# Patient Record
Sex: Female | Born: 2000 | Race: Black or African American | Hispanic: No | Marital: Single | Smoking: Never smoker
Health system: Southern US, Community
[De-identification: ages and names within clinical notes are randomized; demographics above are authoritative.]

## PROBLEM LIST (undated history)

## (undated) DIAGNOSIS — N12 Tubulo-interstitial nephritis, not specified as acute or chronic: Secondary | ICD-10-CM

## (undated) DIAGNOSIS — Z7289 Other problems related to lifestyle: Secondary | ICD-10-CM

## (undated) DIAGNOSIS — J45909 Unspecified asthma, uncomplicated: Secondary | ICD-10-CM

## (undated) HISTORY — PX: MOUTH SURGERY: SHX715

## (undated) HISTORY — DX: Unspecified asthma, uncomplicated: J45.909

---
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
## 2018-06-19 ENCOUNTER — Ambulatory Visit (INDEPENDENT_AMBULATORY_CARE_PROVIDER_SITE_OTHER): Payer: Medicaid Other | Admitting: Pediatrics

## 2018-06-19 ENCOUNTER — Encounter: Payer: Self-pay | Admitting: Pediatrics

## 2018-06-19 VITALS — BP 116/72 | HR 106 | Ht 62.0 in | Wt 138.6 lb

## 2018-06-19 DIAGNOSIS — Z113 Encounter for screening for infections with a predominantly sexual mode of transmission: Secondary | ICD-10-CM

## 2018-06-19 NOTE — Patient Instructions (Addendum)
Please call us if there are issues. Your nexlanon is due to be replaced in February 2021.

## 2018-06-19 NOTE — Progress Notes (Signed)
THIS RECORD MAY CONTAIN CONFIDENTIAL INFORMATION THAT SHOULD NOT BE RELEASED WITHOUT REVIEW OF THE SERVICE PROVIDER.  Adolescent Medicine Consultation Follow-Up Visit Julie Cherry  is a 17 y.o. female referred by Gregor Hams, NP here today for follow-up regarding nexplanon.    Last seen in [facility] Clinic on 10/29/17 for nexplanon BTB.  Plan at last visit included stopping OCP as bleeding was more controlled.  Pertinent Labs? No Growth Chart Viewed? yes   History was provided by the patient.  Interpreter? no  PCP Confirmed?  yes  My Chart Activated?   no   Chief Complaint  Patient presents with  . Follow-up    HPI:   No current issues. Stopped OCPs in Sept 2018. Having regular periods, last on 05/31/18, was light. Graduated from [employer], going to [employer] in August.   Sexually active with same partner. Uses condoms. No concerns today.    Review of Systems  Constitutional: Negative for malaise/fatigue.  Eyes: Negative for double vision.  Respiratory: Negative for shortness of breath.   Cardiovascular: Negative for chest pain and palpitations.  Gastrointestinal: Negative for abdominal pain, constipation, diarrhea, nausea and vomiting.  Genitourinary: Negative for dysuria.  Musculoskeletal: Negative for joint pain and myalgias.  Skin: Negative for rash.  Neurological: Negative for dizziness and headaches.  Endo/Heme/Allergies: Does not bruise/bleed easily.  Psychiatric/Behavioral: Negative for depression.     Patient's last menstrual period was 05/31/2018 (exact date). Allergies  Allergen Reactions  . Pork-Derived Products     Patient does not eat pork but can have meds/products with pork    No outpatient medications prior to visit.   No facility-administered medications prior to visit.      Patient Active Problem List   Diagnosis Date Noted  . Breakthrough bleeding on Nexplanon 07/19/2017  . Pyelonephritis 05/27/2016  .  Vitamin D deficiency 01/20/2016  . Heart murmur 12/02/2013  . 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 Vaccination not carried out because of caregiver refusal 09/10/2013  . Allergic rhinitis 09/10/2013     The following portions of the patient's history were reviewed and updated as appropriate: allergies, current medications, past family history, past medical history, past social history, past surgical history and problem list.  Physical Exam:  Vitals:   06/19/18 1555  BP: 116/72  Pulse: (!) 106  Weight: 138 lb 9.6 oz (62.9 kg)  Height: 5\' 2"  (1.575 m)   BP 116/72   Pulse (!) 106   Ht 5\' 2"  (1.575 m)   Wt 138 lb 9.6 oz (62.9 kg)   LMP 05/31/2018 (Exact Date)   BMI 25.35 kg/m  Body mass index: body mass index is 25.35 kg/m. Blood pressure percentiles are not available for patients who are 18 years or older.   Physical Exam  Constitutional: She appears well-developed. No distress.  HENT:  Mouth/Throat: Oropharynx is clear and moist.  Neck: No thyromegaly present.  Cardiovascular: Regular rhythm.  No murmur heard. Tachycardic  Pulmonary/Chest: Breath sounds normal.  Abdominal: Soft. She exhibits no mass. There is no tenderness. There is no guarding.  Musculoskeletal: She exhibits no edema.  Lymphadenopathy:    She has no cervical adenopathy.  Neurological: She is alert.  Skin: Skin is warm. No rash noted.  Psychiatric: She has a normal mood and affect.  Nursing note and vitals reviewed.   Assessment/Plan: 1. Breakthrough bleeding on Nexplanon, improved Has been off OCPs since Sept 2018, bleeding is now more regulated. Will monitor. Advised to call if concerns, new partner, vaginal discharge,  etc.  - Nexplanon due to be replaced in Feb 2021   Follow-up:  As needed   Medical decision-making:  >15 minutes spent face to face with patient with more than 50% of appointment spent discussing diagnosis, management, follow-up, and reviewing of nexplanon, flu  vaccine.

## 2018-06-20 ENCOUNTER — Encounter: Payer: Self-pay | Admitting: Pediatrics

## 2018-06-20 ENCOUNTER — Ambulatory Visit (INDEPENDENT_AMBULATORY_CARE_PROVIDER_SITE_OTHER): Payer: Medicaid Other | Admitting: Pediatrics

## 2018-06-20 VITALS — BP 123/80 | HR 101 | Ht 63.39 in | Wt 139.4 lb

## 2018-06-20 DIAGNOSIS — A749 Chlamydial infection, unspecified: Secondary | ICD-10-CM | POA: Diagnosis not present

## 2018-06-20 DIAGNOSIS — Z113 Encounter for screening for infections with a predominantly sexual mode of transmission: Secondary | ICD-10-CM | POA: Diagnosis not present

## 2018-06-20 DIAGNOSIS — A549 Gonococcal infection, unspecified: Secondary | ICD-10-CM | POA: Diagnosis not present

## 2018-06-20 LAB — C. TRACHOMATIS/N. GONORRHOEAE RNA
C. trachomatis RNA, TMA: DETECTED — AB
N. GONORRHOEAE RNA, TMA: DETECTED — AB

## 2018-06-20 MED ORDER — CEFTRIAXONE SODIUM 250 MG IJ SOLR
250.0000 mg | Freq: Once | INTRAMUSCULAR | Status: AC
  Administered 2018-06-20: 250 mg via INTRAMUSCULAR

## 2018-06-20 MED ORDER — AZITHROMYCIN 250 MG PO TABS
1000.0000 mg | ORAL_TABLET | Freq: Once | ORAL | Status: AC
  Administered 2018-06-20: 1000 mg via ORAL

## 2018-06-20 NOTE — Patient Instructions (Signed)
We will call you with results  

## 2018-06-20 NOTE — Progress Notes (Signed)
History was provided by the patient.  Julie Cherry is a 17 y.o. female who is here for positive test for chalmydia, gonorrhea.     HPI:   She reports no symptoms- no vaginal bleeding, discharge reported. No pain with sex, cramping, abdominal pain.  No fevers, vomiting, rashes, headaches, diarrhea, myalgias, weight loss.  Has had the same partner since 2016. Last sex last month. Reports consistent condom use. She thinks that her partner may have had a new partner.  Patient Active Problem List   Diagnosis Date Noted  . Breakthrough bleeding on Nexplanon 07/19/2017  . Pyelonephritis 05/27/2016  . Vitamin D deficiency 01/20/2016  . Heart murmur 12/02/2013  . 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 Vaccination not carried out because of caregiver refusal 09/10/2013  . Allergic rhinitis 09/10/2013     The following portions of the patient's history were reviewed and updated as appropriate: allergies, current medications, past family history, past medical history, past social history, past surgical history and problem list.  Physical Exam:    Vitals:   06/20/18 1038  BP: 123/80  Pulse: (!) 101  Weight: 139 lb 6.4 oz (63.2 kg)  Height: 5' 3.39" (1.61 m)   Growth parameters are noted and are appropriate for age. Blood pressure percentiles are not available for patients who are 18 years or older. Patient's last menstrual period was 05/31/2018 (exact date).  General: Well developed, well nourished female in no acute distress.   Head: Normocephalic, atraumatic.   Eyes:  Pupils equal and round.   Ears/Nose/Mouth/Throat: Nares patent, mucous membranes moist.   Neck: supple, no cervical lymphadenopathy Cardiovascular: regular rate, normal S1/S2, no murmurs Respiratory: No increased work of breathing.  Lungs clear to auscultation bilaterally.  No wheezes. Abdomen: soft, nontender, nondistended. Normal bowel sounds.  No appreciable masses  Genitourinary: no cervical motion  tenderness, cervial os with white discharge Extremities: warm, well perfused, cap refill < 2 sec.   Musculoskeletal: Normal muscle mass.  Normal strength Skin: warm, dry.  No rash or lesions. Neurologic: alert and oriented, normal speech, no tremor     Assessment/Plan: 1. Chlamydia - azithromycin (ZITHROMAX) tablet 1,000 mg  2. Gonorrhea - azithromycin (ZITHROMAX) tablet 1,000 mg - cefTRIAXone (ROCEPHIN) injection 250 mg  3. Routine screening for STI (sexually transmitted infection)  - HIV antibody - RPR - C. trachomatis/N. gonorrhoeae RNA (re-tested per patient preference as she reports consistent condom use and same partner since last negative screen) - WET PREP BY MOLECULAR PROBE  - Immunizations today: none  - Follow-up visit in 1 month for retest, or sooner as needed.

## 2018-06-21 LAB — C. TRACHOMATIS/N. GONORRHOEAE RNA
C. TRACHOMATIS RNA, TMA: DETECTED — AB
N. GONORRHOEAE RNA, TMA: DETECTED — AB

## 2018-06-22 LAB — WET PREP BY MOLECULAR PROBE
Candida species: NOT DETECTED
MICRO NUMBER: 90854100
SPECIMEN QUALITY:: ADEQUATE
Trichomonas vaginosis: NOT DETECTED

## 2018-06-27 LAB — RPR: RPR Ser Ql: NONREACTIVE

## 2018-06-27 LAB — WET PREP BY MOLECULAR PROBE

## 2018-06-27 LAB — HIV ANTIBODY (ROUTINE TESTING W REFLEX): HIV 1&2 Ab, 4th Generation: NONREACTIVE

## 2018-07-03 ENCOUNTER — Other Ambulatory Visit: Payer: Self-pay

## 2018-07-03 ENCOUNTER — Ambulatory Visit (INDEPENDENT_AMBULATORY_CARE_PROVIDER_SITE_OTHER): Payer: Medicaid Other | Admitting: Student in an Organized Health Care Education/Training Program

## 2018-07-03 ENCOUNTER — Encounter: Payer: Self-pay | Admitting: Student in an Organized Health Care Education/Training Program

## 2018-07-03 ENCOUNTER — Ambulatory Visit (INDEPENDENT_AMBULATORY_CARE_PROVIDER_SITE_OTHER): Payer: Medicaid Other | Admitting: Licensed Clinical Social Worker

## 2018-07-03 VITALS — BP 114/64 | Ht 62.0 in | Wt 141.1 lb

## 2018-07-03 DIAGNOSIS — Z1331 Encounter for screening for depression: Secondary | ICD-10-CM

## 2018-07-03 DIAGNOSIS — Z68.41 Body mass index (BMI) pediatric, 85th percentile to less than 95th percentile for age: Secondary | ICD-10-CM | POA: Diagnosis not present

## 2018-07-03 DIAGNOSIS — E663 Overweight: Secondary | ICD-10-CM

## 2018-07-03 DIAGNOSIS — Z0001 Encounter for general adult medical examination with abnormal findings: Secondary | ICD-10-CM

## 2018-07-03 DIAGNOSIS — Z113 Encounter for screening for infections with a predominantly sexual mode of transmission: Secondary | ICD-10-CM

## 2018-07-03 NOTE — BH Specialist Note (Signed)
Integrated Behavioral Health Initial Visit  MRN: [id number] Name: Julie Cherry  Number of Integrated Behavioral Health Clinician visits:: 1/6 Session Start time: 5:20  Session End time: 5:30 Total time: 10 mins, no charge due to brief visit  Type of Service: Integrated Behavioral Health- Individual/Family Interpretor:No. Interpretor Name and Language: n/a   Warm Hand Off Completed.       SUBJECTIVE: Julie Cherry is a 17 y.o. female accompanied by self Patient was referred by Dr. Lazarus SalinesSegars for PHQ review. [facility] introduced services in [facility] Model and role within the clinic. [facility] provided [facility] and business card with contact information. Pt voiced understanding and denied any need for services at this time. [facility] is open to visits in the future as needed.   OBJECTIVE: Mood: Euthymic and Affect: Appropriate Risk of harm to self or others: No plan to harm self or others  LIFE CONTEXT: Family and Social: Lives w/ parents and siblings School/Work: Pt recently graduated from [employer], will start [employer] in the fall, will pursue nursing, reports feeling excited and a little nervous about starting school Self-Care: Pt reports sleeping when overwhelmed. Pt likes to hang out with friends and family Life Changes: recently graduated from high school, will start college in the fall  GOALS ADDRESSED: Patient will: 1. Identify barriers to social emotional development 2. Increase awareness of [facility] role in integrated care model  INTERVENTIONS: Interventions utilized: Supportive Counseling and Psychoeducation and/or Health Education  Standardized Assessments completed: PHQ 9 Modified for Teens; score of 0, results in flowsheets   Noralyn PickHannah G Moore, LPCA

## 2018-07-03 NOTE — Patient Instructions (Signed)
  Place appropriate patient instructions here. 

## 2018-07-03 NOTE — Progress Notes (Signed)
Adolescent Well Care Visit Julie Cherry is a 17 y.o. female who is here for well care.    PCP:  Ander Slade, NP   History was provided by the patient.  Confidentiality was discussed with the patient and, if applicable, with caregiver as well. Patient's personal or confidential phone number: [phone]   Current Issues: - Recent illness: congestion, sore throat, cold, body aches, ear pain starting Saturday. No fever. All symptoms improving with ibuprofen, nyquil, dayquil. Last dose of nyquil last night at 10pm. No fever, vaginal discharge, headache, Contact with sister with URI. - Sexual health (BV, G+C positive two weeks ago): has not been sexually active since positive G+C two weeks ago. Still with sexually partner and plan to have sex in the future. Using condoms,  given more condoms today. No vaginal discharge, pelvic pain. Nexplanon for one year.  - Irregular menstrual bleeding: well controlled. LMP 06/30/18. - Allergies: controlled - Self harm: controlled, no SI for 5 years - Vaccines needed for college -- mom concerned about need for MenB, HepA vaccine. Mom reports that Varicella vaccine was given in childhood, though not documented at [facility].  Mom denies HPV vaccine.   Nutrition: Nutrition/Eating Behaviors: good Adequate calcium in diet?: yes Supplements/ Vitamins: no  Exercise/ Media: Play any Sports?/ Exercise: yes, walk at water park, cleaning Screen Time:  "all the time" Media Rules or Monitoring?: no  Sleep:  Sleep: no   Social Screening: Lives with:  Mom, sister, brother Parental relations:  good Activities, Work, and Research officer, political party?: work  Concerns regarding behavior with peers?  no Stressors of note: no  Education: School Name: About to start [employer] Grade: freshman college School performance: doing well; no concerns School Behavior: doing well; no concerns  Menstruation:   Patient's last menstrual period was 05/31/2018  (exact date). Menstrual History: irregular since Nexplanon one year ago, but well controlled recently   Confidential Social History: Tobacco?  no Secondhand smoke exposure?  no Drugs/ETOH?  yes, MJ one month ago, used to do two time per month  Sexually Active?  Not for two weeks Pregnancy Prevention: nnexplanon  Safe at home, in school & in relationships?  Yes Safe to self?  Yes   Screenings: Patient has a dental home: yes, [facility], [employer]  The patient completed the Rapid Assessment for Adolescent Preventive Services screening questionnaire and the following topics were identified as risk factors and discussed: condom use, birth control, sexuality and suicidality/self harm  In addition, the following topics were discussed as part of anticipatory guidance healthy eating, exercise, tobacco use, marijuana use, drug use, condom use, birth control, sexuality, suicidality/self harm, mental health issues, school problems, family problems and screen time.  PHQ-9 completed and results indicated no concern for depression  Physical Exam:  Vitals:   07/03/18 1557  BP: 114/64  Weight: 141 lb 2 oz (64 kg)  Height: 5' 2"  (1.575 m)   BP 114/64 (BP Location: Left Arm, Patient Position: Sitting, Cuff Size: Normal)   Ht 5' 2"  (1.575 m)   Wt 141 lb 2 oz (64 kg)   LMP 05/31/2018 (Exact Date)   BMI 25.81 kg/m  Body mass index: body mass index is 25.81 kg/m. Blood pressure percentiles are not available for patients who are 18 years or older.   Hearing Screening   Method: Audiometry   125Hz  250Hz  500Hz  1000Hz  2000Hz  3000Hz  4000Hz  6000Hz  8000Hz   Right ear:   20 20 20  20     Left  ear:   20 20 20  20       Visual Acuity Screening   Right eye Left eye Both eyes  Without correction:     With correction: 10/10 10/10 10/10     General Appearance:   alert, oriented, no acute distress  HENT: Normocephalic, no obvious abnormality, conjunctiva clear  Mouth:   Normal appearing  teeth, no obvious discoloration, dental caries, or dental caps, no erythema or exudate of oropharynx  Neck:   Supple; thyroid: no enlargement, symmetric. Tenderness with mild cervical lympahdenopathy.  Chest Nontender. Breasts symmetric without masses.  Lungs:   Clear to auscultation bilaterally, normal work of breathing  Heart:   Regular rate and rhythm, S1 and S2 normal, no murmurs;   Abdomen:   Soft, non-tender, no mass, or organomegaly  GU genitalia not examined  Musculoskeletal:   Tone and strength strong and symmetrical, all extremities               Lymphatic:   No cervical adenopathy  Skin/Hair/Nails:   Skin warm, dry and intact, no rashes, no bruises or petechiae  Neurologic:   Strength, gait, and coordination normal and age-appropriate     Assessment and Plan:   1. Encounter for general adult medical examination with abnormal findings Moving to [place] for college in one week. BMI is not appropriate for age Hearing screening result:normal Vision screening result: normal  2. Overweight, pediatric, BMI 85.0-94.9 percentile for age BMI 85%. Diverse diet, gets daily exercise. Discussed healthy diet, exercise habits.  - Lipid panel per screening protocol.  3. Routine screening for STI (sexually transmitted infection) Nexplanon in place for one year. No vaginal discharge or pelvic pain or fevers. Completed azithromycin and ceftriaxone at last visit. Plan to repeat test of reinfection in 1-3 months.    No follow-ups on file.Harlon Ditty, MD

## 2018-07-04 LAB — LIPID PANEL
Cholesterol: 134 mg/dL (ref <170)
HDL: 45 mg/dL — ABNORMAL LOW (ref >45)
LDL CHOLESTEROL (CALC): 76 mg/dL (ref <110)
Non-HDL Cholesterol (Calc): 89 mg/dL (calc) (ref <120)
TRIGLYCERIDES: 51 mg/dL (ref <90)
Total CHOL/HDL Ratio: 3 (calc) (ref <5.0)

## 2018-07-16 DIAGNOSIS — Z23 Encounter for immunization: Secondary | ICD-10-CM | POA: Diagnosis not present

## 2018-11-15 ENCOUNTER — Encounter (HOSPITAL_COMMUNITY): Payer: Self-pay

## 2018-11-15 ENCOUNTER — Other Ambulatory Visit: Payer: Self-pay

## 2018-11-15 ENCOUNTER — Ambulatory Visit (HOSPITAL_COMMUNITY)
Admission: EM | Admit: 2018-11-15 | Discharge: 2018-11-15 | Disposition: A | Discharge status: Home / Self Care | Payer: Medicaid Other | Attending: Family Medicine | Admitting: Family Medicine

## 2018-11-15 DIAGNOSIS — J111 Influenza due to unidentified influenza virus with other respiratory manifestations: Secondary | ICD-10-CM | POA: Diagnosis not present

## 2018-11-15 DIAGNOSIS — M549 Dorsalgia, unspecified: Secondary | ICD-10-CM | POA: Diagnosis present

## 2018-11-15 DIAGNOSIS — R51 Headache: Secondary | ICD-10-CM | POA: Diagnosis present

## 2018-11-15 LAB — POCT URINALYSIS DIP (DEVICE)
Bilirubin Urine: NEGATIVE
GLUCOSE, UA: NEGATIVE mg/dL
Ketones, ur: 15 mg/dL — AB
Leukocytes, UA: NEGATIVE
Nitrite: NEGATIVE
Protein, ur: NEGATIVE mg/dL
SPECIFIC GRAVITY, URINE: 1.015 (ref 1.005–1.030)
Urobilinogen, UA: 0.2 mg/dL (ref 0.0–1.0)
pH: 6 (ref 5.0–8.0)

## 2018-11-15 MED ORDER — OSELTAMIVIR PHOSPHATE 75 MG PO CAPS: 75.0000 mg | ORAL_CAPSULE | Freq: Two times a day (BID) | ORAL | 0 refills | Status: DC

## 2018-11-15 NOTE — Discharge Instructions (Signed)
Your urine test does not show infection.  We are running a urine culture to make absolutely certain that there is no bacteria in the urinary system.  The headache, muscle aches, and fever are most typical for influenza.  For that reason I am prescribing flu medicine.  You will feel better if you take some ibuprofen every 6 hours the next couple days.

## 2018-11-15 NOTE — ED Provider Notes (Signed)
[facility]    CSN: [id number] Arrival date & time: 11/15/18  1521     History   Chief Complaint Chief Complaint  Patient presents with  . Back Pain    HPI Julie Cherry is a 17 y.o. female.   This is a 17 year old female(initial [facility] visit)who presents with headaches, back pain, and fever for the last 4 days.  She has a history of pyelonephritis.  She states that the symptoms are getting worse over the last 24 hours.  She has not been in contact with anybody with the flu that she knows of.  No nausea or vomiting, cough is minimally productive-patient has not looked at what the phlegm looks like.  She is not short of breath.  She has no urinary symptoms.  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  She is not a very good historian.     Past Medical History:  Diagnosis Date  . Asthma    Last had symptoms age 89.     Patient Active Problem List   Diagnosis Date Noted  . Breakthrough bleeding on Nexplanon 07/19/2017  . Pyelonephritis 05/27/2016  . Vitamin D deficiency 01/20/2016  . Heart murmur 12/02/2013  . 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 Vaccination not carried out because of caregiver refusal 09/10/2013  . Allergic rhinitis 09/10/2013    Past Surgical History:  Procedure Laterality Date  . MOUTH SURGERY    . TOOTH EXTRACTION  Aug 2014   severe decay    OB History   No obstetric history on file.      Home Medications    Prior to Admission medications   Medication Sig Start Date End Date Taking? Authorizing Provider  oseltamivir (TAMIFLU) 75 MG capsule Take 1 capsule (75 mg total) by mouth every 12 (twelve) hours. 11/15/18   Elvina SidleLauenstein, Amana Bouska, MD    Family History Family History  Problem Relation Age of Onset  . Asthma Father   . Diabetes Paternal Grandfather     Social History Social History   Tobacco Use  . Smoking status: Never Smoker  . Smokeless tobacco: Never Used  Substance Use Topics  . Alcohol  use: No  . Drug use: No     Allergies   Pork-derived products   Review of Systems Review of Systems   Physical Exam Triage Vital Signs ED Triage Vitals  Enc Vitals Group     BP 11/15/18 1536 121/62     Pulse --      Resp 11/15/18 1536 16     Temp 11/15/18 1536 (!) 100.6 F (38.1 C)     Temp Source 11/15/18 1536 Oral     SpO2 11/15/18 1536 100 %     Weight 11/15/18 1535 130 lb (59 kg)     Height --      Head Circumference --      Peak Flow --      Pain Score 11/15/18 1534 8     Pain Loc --      Pain Edu? --      Excl. in GC? --    No data found.  Updated Vital Signs BP 121/62 (BP Location: Right Arm)   Temp (!) 100.6 F (38.1 C) (Oral)   Resp 16   Wt 59 kg   LMP 11/10/2018   SpO2 100%   BMI 23.78 kg/m    Physical Exam Vitals signs and nursing note reviewed.  Constitutional:      Appearance: Normal appearance.  HENT:     Head: Normocephalic.     Right Ear: Tympanic membrane and external ear normal.     Left Ear: Tympanic membrane and external ear normal.     Nose: Nose normal.     Mouth/Throat:     Mouth: Mucous membranes are moist.  Eyes:     Conjunctiva/sclera: Conjunctivae normal.  Neck:     Musculoskeletal: Normal range of motion and neck supple.  Cardiovascular:     Rate and Rhythm: Normal rate.     Heart sounds: Normal heart sounds.  Pulmonary:     Effort: Pulmonary effort is normal.     Breath sounds: Normal breath sounds.  Musculoskeletal: Normal range of motion.  Skin:    General: Skin is warm and dry.  Neurological:     General: No focal deficit present.     Mental Status: She is alert.  Psychiatric:        Mood and Affect: Mood normal.        Thought Content: Thought content normal.        Judgment: Judgment normal.      UC Treatments / Results  Labs (all labs ordered are listed, but only abnormal results are displayed) Labs Reviewed  POCT URINALYSIS DIP (DEVICE) - Abnormal; Notable for the following components:       Result Value   Ketones, ur 15 (*)    Hgb urine dipstick MODERATE (*)    All other components within normal limits  URINE CULTURE    EKG None  Radiology No results found.  Procedures Procedures (including critical care time)  Medications Ordered in UC Medications - No data to display  Initial Impression / Assessment and Plan / UC Course  I have reviewed the triage vital signs and the nursing notes.  Pertinent labs & imaging results that were available during my care of the patient were reviewed by me and considered in my medical decision making (see chart for details).    Final Clinical Impressions(s) / UC Diagnoses   Final diagnoses:  Influenza     Discharge Instructions     Your urine test does not show infection.  We are running a urine culture to make absolutely certain that there is no bacteria in the urinary system.  The headache, muscle aches, and fever are most typical for influenza.  For that reason I am prescribing flu medicine.  You will feel better if you take some ibuprofen every 6 hours the next couple days.    ED Prescriptions    Medication Sig Dispense Auth. Provider   oseltamivir (TAMIFLU) 75 MG capsule Take 1 capsule (75 mg total) by mouth every 12 (twelve) hours. 10 capsule Elvina Sidle, MD     Controlled Substance Prescriptions [place] Controlled Substance Registry consulted? Not Applicable   Elvina Sidle, MD 11/15/18 1600

## 2018-11-15 NOTE — ED Triage Notes (Signed)
Pt cc back pain and headaches x 4 days

## 2018-11-17 ENCOUNTER — Telehealth (HOSPITAL_COMMUNITY): Payer: Self-pay | Admitting: Emergency Medicine

## 2018-11-17 LAB — URINE CULTURE: Culture: 10000 — AB

## 2018-11-17 MED ORDER — SULFAMETHOXAZOLE-TRIMETHOPRIM 800-160 MG PO TABS: 1.0000 | ORAL_TABLET | Freq: Two times a day (BID) | ORAL | 0 refills | Status: AC

## 2018-11-17 NOTE — Telephone Encounter (Signed)
Pt called back about results. All questions answered.  

## 2018-11-17 NOTE — Telephone Encounter (Signed)
Urine culture was positive Proteus mirabilist. Prescription for bactrim per protocol sent to pharmacy of choice. Attempted to reach patient. No answer at this time. Voicemail left.

## 2019-05-14 DIAGNOSIS — H52223 Regular astigmatism, bilateral: Secondary | ICD-10-CM | POA: Diagnosis not present

## 2019-05-15 DIAGNOSIS — H5213 Myopia, bilateral: Secondary | ICD-10-CM | POA: Diagnosis not present

## 2019-06-03 DIAGNOSIS — H52223 Regular astigmatism, bilateral: Secondary | ICD-10-CM | POA: Diagnosis not present

## 2019-06-30 ENCOUNTER — Other Ambulatory Visit: Payer: Self-pay | Admitting: Pediatrics

## 2019-06-30 ENCOUNTER — Encounter: Payer: Self-pay | Admitting: Pediatrics

## 2019-07-04 ENCOUNTER — Telehealth: Payer: Self-pay | Admitting: Pediatrics

## 2019-07-04 NOTE — Telephone Encounter (Signed)

## 2019-07-07 ENCOUNTER — Encounter: Payer: Self-pay | Admitting: Pediatrics

## 2019-07-07 ENCOUNTER — Ambulatory Visit (INDEPENDENT_AMBULATORY_CARE_PROVIDER_SITE_OTHER): Payer: Medicaid Other | Admitting: Pediatrics

## 2019-07-07 ENCOUNTER — Other Ambulatory Visit: Payer: Self-pay

## 2019-07-07 VITALS — BP 94/62 | HR 83 | Ht 62.25 in | Wt 137.0 lb

## 2019-07-07 DIAGNOSIS — Z8639 Personal history of other endocrine, nutritional and metabolic disease: Secondary | ICD-10-CM | POA: Diagnosis not present

## 2019-07-07 DIAGNOSIS — Z113 Encounter for screening for infections with a predominantly sexual mode of transmission: Secondary | ICD-10-CM

## 2019-07-07 DIAGNOSIS — Z0001 Encounter for general adult medical examination with abnormal findings: Secondary | ICD-10-CM

## 2019-07-07 DIAGNOSIS — Z68.41 Body mass index (BMI) pediatric, 5th percentile to less than 85th percentile for age: Secondary | ICD-10-CM | POA: Diagnosis not present

## 2019-07-07 LAB — POCT RAPID HIV: Rapid HIV, POC: NEGATIVE

## 2019-07-07 NOTE — Patient Instructions (Signed)
Well Child Safety, Young Adult This sheet provides general safety recommendations. Talk with a health care provider if you have any questions. Home safety  Make sure your home or apartment has smoke detectors and carbon monoxide detectors. Test them once a month. Change their batteries every year.  If you keep guns and ammunition in the home, make sure they are stored separately and locked away.  Make your home a tobacco-free and drug-free environment. Motor vehicle safety   Wear a seat belt whenever you drive or ride in a vehicle.  Do not text, talk, or use your phone or other mobile devices while driving.  Do not drive when you are tired. If you feel like you may fall asleep while driving, pull over at a safe location and take a break or switch drivers.  Do not drive after drinking or using drugs. Plan for a designated driver or another way to go home.  Do not ride in a car with someone who has been using drugs or alcohol.  Do not ride in the bed or cargo area of a pickup truck. Sun safety   Use broad-spectrum sunscreen that protects against UVA and UVB radiation (SPF 15 or higher). ? Put on sunscreen 15-30 minutes before going outside. ? Reapply sunscreen every 2 hours, or more often if you get wet or if you are sweating. ? Use enough sunscreen to cover all exposed areas. Rub it in well.  Wear sunglasses when you are out in the sun.  Do not use tanning beds. Tanning beds are just as harmful for your skin as the sun. Water safety  Never swim alone.  Only swim in designated areas.  Do not swim in areas where you do not know the water conditions or where underwater hazards are located. General instructions  Protect your hearing and avoid exposure to loud music or noises by: ? Wearing ear protection when you are in a noisy environment (while using loud machinery, like a lawn mower, or at concerts). ? Making sure that the volume is not too loud when listening to music in  the car or through headphones.  Avoid tattoos and body piercings. Tattoos and body piercings can get infected. Personal safety  Do not use tobacco, drugs, anabolic steroids, or diet pills.  Do not drink or use drugs while swimming, boating, riding a bike or motorcycle, or using heavy machinery.  Do not drink heavily (binge drink). Your brain is still developing, and alcohol can affect your brain development.  Wear protective gear for sports and other physical activities, such as a helmet, mouth guard, eye protection, wrist guards, elbow pads, and knee pads. Wear a helmet when biking, riding a motorcycle or all-terrain vehicle (ATV), skateboarding, skiing, or snowboarding.  If you are sexually active, practice safe sex. Use a condom or other form of birth control (contraception) in order to prevent pregnancy and STIs (sexually transmitted infections).  Never leave a party or event alone without telling a friend that you are leaving. Never leave with a stranger.  Do not misuse medicines. This means that you should not take a medicine other than how it is prescribed and you should not take someone else's medicine.  Avoid risky situations or situations where you do not feel safe. Call for help if you find yourself in an unsafe situation.  Learn to manage conflict without using violence.  Never accept a drink from a stranger if you do not know where the drink came from.  Avoid people  who suggest unsafe or harmful behavior, and avoid unhealthy romantic relationships or friendships where you do not feel respected. No one has the right to pressure you into any activity that makes you feel uncomfortable. If others make you feel unsafe, you can: ? Ask for help from your parents or guardians, your health care provider, or other trusted adults like a teacher, coach, or counselor. ? Call the National Domestic Violence Hotline at 800-799-7233 or go online: www.thehotline.org Where to find more  information:  American Academy of Pediatrics: www.healthychildren.org  Centers for Disease Control and Prevention: www.cdc.gov Summary  Protect yourself from sun exposure by using broad-spectrum sunscreen that protects against UVA and UVB radiation (SPF 15 or higher).  Wear appropriate protective gear when playing sports and doing other activities. Gear may include a helmet, mouth guard, eye protection, wrist guards, and elbow and knee pads.  Be safe when driving or riding in vehicles. While driving: Wear a seat belt. Do not use your mobile device. Do not drink or use drugs.  Always be aware of your surroundings. Avoid risky situations or places where you feel unsafe.  Avoid relationships or friendships in which you do not feel respected. It is okay to ask for help from your parents or guardians, your health care provider, or other trusted adults like a teacher, coach, or counselor. This information is not intended to replace advice given to you by your health care provider. Make sure you discuss any questions you have with your health care provider. Document Released: 07/02/2017 Document Revised: 03/10/2019 Document Reviewed: 07/02/2017 Elsevier Patient Education  2020 Elsevier Inc.  

## 2019-07-07 NOTE — Progress Notes (Signed)
Adolescent Well Care Visit Julie Cherry is a 18 y.o. female who is here for well care.    PCP:  Gregor Hamsebben, Daimian Sudberry, NP   History was provided by the patient.  Confidentiality was discussed with the patient and, if applicable, with caregiver as well. Patient's personal or confidential phone number: [phone]   Current Issues: Current concerns include:  Just ended a period last week that lasted 8 days, which is longer than usual.   Nutrition: Nutrition/Eating Behaviors: tries to eat a variety of foods.  Was on the meal plan at school, now living at home Adequate calcium in diet?: milk and yogurt Supplements/ Vitamins: no  Exercise/ Media: Play any Sports?/ Exercise: had access to gym at school, takes walks Screen Time:  > 2 hours-counseling provided Media Rules or Monitoring?: no  Sleep:  Sleep: no problems  Social Screening: Lives with:  Mom, brother and sister.  Was living in the dorm last year at college but this fall will start online courses and live at home Parental relations:  good Activities, Work, and Regulatory affairs officerChores?: working at [employer] Concerns regarding behavior with peers?  no Stressors of note: pandemic, uncertainty about school  Education: School Name[employer]  School Grade: rising sophomore School performance: did well last year, liked her nursing courses School Behavior: doing well; no concerns  Menstruation:   Patient's last menstrual period was 07/05/2019 (exact date). Menstrual History: periods have been more or less regular but last one lasted longer than usual   Confidential Social History: Tobacco?  no Secondhand smoke exposure?  no Drugs/ETOH?  no  Sexually Active?  Yes.  Denies GU complaints or discharge Pregnancy Prevention: Nexplanon  Safe at home, in school & in relationships?  Yes Safe to self?  Yes   Screenings: Patient has a dental home: yes  The patient completed the Rapid Assessment for Adolescent  Preventive Services screening questionnaire and the following topics were identified as risk factors and discussed: weapon use and condom useCarries pepper spray when on campus.   In addition, the following topics were discussed as part of anticipatory guidance healthy eating and exercise.  PHQ-9 completed and results indicated no concerns for depression  Physical Exam:  Vitals:   07/07/19 1337  BP: 94/62  Pulse: 83  SpO2: 100%  Weight: 137 lb (62.1 kg)  Height: 5' 2.25" (1.581 m)   BP 94/62 (BP Location: Right Arm, Patient Position: Sitting, Cuff Size: Normal)   Pulse 83   Ht 5' 2.25" (1.581 m)   Wt 137 lb (62.1 kg)   LMP 07/05/2019 (Exact Date)   SpO2 100%   BMI 24.86 kg/m  Body mass index: body mass index is 24.86 kg/m. Blood pressure percentiles are not available for patients who are 18 years or older.   Hearing Screening   Method: Audiometry   125Hz  250Hz  500Hz  1000Hz  2000Hz  3000Hz  4000Hz  6000Hz  8000Hz   Right ear:   25 40 20  20    Left ear:   20 20 20  20       Visual Acuity Screening   Right eye Left eye Both eyes  Without correction: 20/20 20/20 20/20   With correction:       General Appearance:   alert, cooperative, pleasant young lady  HENT: Normocephalic, no obvious abnormality, conjunctiva clear, RRx2  Mouth:   Normal appearing teeth, no obvious discoloration, dental caries, or dental caps  Neck:   Supple; thyroid: no enlargement, symmetric, no tenderness/mass/nodules  Chest No breast masses, Tanner 5  Lungs:   Clear to auscultation bilaterally, normal work of breathing  Heart:   Regular rate and rhythm, S1 and S2 normal, no murmurs;   Abdomen:   Soft, non-tender, no mass, or organomegaly  GU genitalia not examined, Tanner stage 5  Musculoskeletal:   Tone and strength strong and symmetrical, all extremities               Lymphatic:   No cervical adenopathy  Skin/Hair/Nails:   Skin warm, dry and intact, no rashes, no bruises or petechiae  Neurologic:    Strength, gait, and coordination normal and age-appropriate     Assessment and Plan:   Well young adult Hx of Vitamin Deficiency   BMI is appropriate for age  Hearing screening result:normal Vision screening result: normal  Immunizations- has not had HPV series or second MCV.  Will need to get from [facility]  Labs per orders:  HgA1c, Vitamin D level  Orders Placed This Encounter  Procedures  . C. trachomatis/N. gonorrhoeae RNA  . POCT Rapid HIV    Return in 1 year for next [facility], or sooner if needed   Ander Slade, PPCNP-BC

## 2019-07-08 LAB — VITAMIN D 25 HYDROXY (VIT D DEFICIENCY, FRACTURES): Vit D, 25-Hydroxy: 12 ng/mL — ABNORMAL LOW (ref 30–100)

## 2019-07-08 LAB — C. TRACHOMATIS/N. GONORRHOEAE RNA
C. trachomatis RNA, TMA: DETECTED — AB
N. gonorrhoeae RNA, TMA: NOT DETECTED

## 2019-07-08 LAB — HEMOGLOBIN A1C
Hgb A1c MFr Bld: 4.9 % of total Hgb (ref <5.7)
Mean Plasma Glucose: 94 (calc)
eAG (mmol/L): 5.2 (calc)

## 2019-07-08 NOTE — Progress Notes (Signed)
Generic message left to call [facility].

## 2019-07-09 ENCOUNTER — Other Ambulatory Visit: Payer: Self-pay | Admitting: Pediatrics

## 2019-07-09 DIAGNOSIS — A749 Chlamydial infection, unspecified: Secondary | ICD-10-CM

## 2019-07-09 DIAGNOSIS — E559 Vitamin D deficiency, unspecified: Secondary | ICD-10-CM

## 2019-07-09 HISTORY — DX: Chlamydial infection, unspecified: A74.9

## 2019-07-09 MED ORDER — VITAMIN D 50 MCG (2000 UT) PO TABS: ORAL_TABLET | ORAL | 11 refills | Status: DC

## 2019-07-09 MED ORDER — AZITHROMYCIN 500 MG PO TABS: ORAL_TABLET | ORAL | 0 refills | Status: DC

## 2019-07-09 NOTE — Progress Notes (Signed)
Patient notified of results and treatment plan, including low Vit D. She would like medications sent to the pharmacy.  Also notified

## 2019-07-09 NOTE — Progress Notes (Signed)
Attempted to contact patient. Message "call can not be completed at this time". Will send letter to call for results.

## 2019-08-05 ENCOUNTER — Other Ambulatory Visit: Payer: Self-pay | Admitting: Pediatrics

## 2019-08-06 ENCOUNTER — Encounter: Payer: Self-pay | Admitting: Pediatrics

## 2019-08-06 ENCOUNTER — Ambulatory Visit (INDEPENDENT_AMBULATORY_CARE_PROVIDER_SITE_OTHER): Payer: Medicaid Other | Admitting: Pediatrics

## 2019-08-06 ENCOUNTER — Other Ambulatory Visit: Payer: Self-pay

## 2019-08-06 VITALS — BP 102/68 | Ht 62.5 in | Wt 134.2 lb

## 2019-08-06 DIAGNOSIS — Z9189 Other specified personal risk factors, not elsewhere classified: Secondary | ICD-10-CM

## 2019-08-06 HISTORY — DX: Other specified personal risk factors, not elsewhere classified: Z91.89

## 2019-08-06 NOTE — Patient Instructions (Signed)
Preventing Sexually Transmitted Infections, Adult Sexually transmitted infections (STIs) are diseases that are passed (transmitted) from person to person through bodily fluids exchanged during sex or sexual contact. Bodily fluids include saliva, semen, blood, vaginal mucus, and urine. You may have an increased risk for developing an STI if you have unprotected oral, vaginal, or anal sex. Some common STIs include:  Herpes.  Hepatitis B.  Chlamydia.  Gonorrhea.  Syphilis.  HPV (human papillomavirus).  HIV (human immunodeficiency virus), the virus that can cause AIDS (acquired immunodeficiency syndrome). How can I protect myself from sexually transmitted infections? The only way to completely prevent STIs is not to have sex of any kind (practice abstinence). This includes oral, vaginal, or anal sex. If you are sexually active, take these actions to lower your risk of getting an STI:  Have only one sex partner (be monogamous) or limit the number of sexual partners you have.  Stay up-to-date on immunizations. Certain vaccines can lower your risk of getting certain STIs, such as: ? Hepatitis A and B vaccines. You may have been vaccinated as a young child, but likely need a booster shot as a teen or young adult. ? HPV vaccine.  Use methods that prevent the exchange of body fluids between partners (barrier protection) every time you have sex. Barrier protection can be used during oral, vaginal, or anal sex. Commonly used barrier methods include: ? Female condom. ? Female condom. ? Dental dam.  Get tested regularly for STIs. Have your sexual partner get tested regularly as well.  Avoid mixing alcohol, drugs, and sex. Alcohol and drug use can affect your ability to make good decisions and can lead to risky sexual behaviors.  Ask your health care provider about taking pre-exposure prophylaxis (PrEP) to prevent HIV infection if you: ? Have a HIV-positive sexual partner. ? Have multiple sexual  partners or partners who do not know their HIV status, and do not regularly use a condom during sex. ? Use injection drugs and share needles. Birth control pills, injections, implants, and intrauterine devices (IUDs) do not protect against STIs. To prevent both STIs and pregnancy, always use a condom with another form of birth control. Some STIs, such as herpes, are spread through skin to skin contact. A condom does not protect you from getting such STIs. If you or your partner have herpes and there is an active flare with open sores, avoid all sexual contact. Why are these changes important? Taking steps to practice safe sex protects you and others. Many STIs can be cured. However, some STIs are not curable and will affect you for the rest of your life. STIs can be passed on to another person even if you do not have symptoms. What can happen if changes are not made? Certain STIs may:  Require you to take medicine for the rest of your life.  Affect your ability to have children (your fertility).  Increase your risk for developing another STI or certain serious health conditions, such as: ? Cervical cancer. ? Head and neck cancer. ? Pelvic inflammatory disease (PID) in women. ? Organ damage or damage to other parts of your body, if the infection spreads.  Be passed to a baby during childbirth. How are sexually transmitted infections treated? If you or your partner know or think that you may have an STI:  Talk with your health care provider about what can be done to treat it. Some STIs can be treated and cured with medicines.  For curable STIs, you and   your partner should avoid sex during treatment and for several days after treatment is complete.  You and your partner should both be treated at the same time, if there is any chance that your partner is infected as well. If you get treatment but your partner does not, your partner can re-infect you when you resume sexual contact.  Do not  have unprotected sex. Where to find more information Learn more about sexually transmitted diseases and infections from:  Centers for Disease Control and Prevention: ? More information about specific STIs: www.cdc.gov/std ? Find places to get sexual health counseling and treatment for free or for a low cost: gettested.cdc.gov  [place].S[place] Department of Health and Human Services: www.womenshealth.gov/publications/our-publications/fact-sheet/sexually-transmitted-infections.html Summary  The only way to completely prevent STIs is not to have sex (practice abstinence), including oral, vaginal, or anal sex.  STIs can spread through saliva, semen, blood, vaginal mucus, urine, or sexual contact.  If you do have sex, limit your number of sexual partners and use a barrier protection method every time you have sex.  If you develop an STI, get treated right away and ask your partner to be treated as well. Do not resume having sex until both of you have completed treatment for the STI. This information is not intended to replace advice given to you by your health care provider. Make sure you discuss any questions you have with your health care provider. Document Released: 11/16/2016 Document Revised: 04/26/2018 Document Reviewed: 11/16/2016 Elsevier Patient Education  2020 Elsevier Inc.  

## 2019-08-06 NOTE — Progress Notes (Signed)
Blood pressure percentiles are not available for patients who are 18 years or older.

## 2019-08-06 NOTE — Progress Notes (Signed)
  Subjective:     Patient ID: [id number] Julie Cherry, female   DOB: 24-Apr-2001, 18 y.o.   MRN: [id number]  HPI:  18 year old female in to be screened for STI.  At her wellness visit, 07/07/2019, she was screened and had to be treated for Chlamydia.  Azithromycin was sent to her pharmacy and she took as directed.  She is not certain her partner got treated though he said he did.  Since that clinic visit, she has had sex and the condom "broke".  LMP 07/31/2019.  She denies any symptoms of abdominal pain or vaginal discharge.  No lesions on genitalia.   Review of Systems     Objective:   Physical Exam- Alert, active teen.  No further exam done today     Assessment:     At risk for STI due to insufficient protection      Plan:     Urine for GC/Chlamydia  Will call for abnormal results and treatment  Condoms given   Ander Slade, PPCNP-BC

## 2019-08-07 LAB — C. TRACHOMATIS/N. GONORRHOEAE RNA
C. trachomatis RNA, TMA: NOT DETECTED
N. gonorrhoeae RNA, TMA: NOT DETECTED

## 2019-08-16 ENCOUNTER — Encounter (HOSPITAL_COMMUNITY): Payer: Self-pay

## 2019-08-16 ENCOUNTER — Other Ambulatory Visit: Payer: Self-pay

## 2019-08-16 ENCOUNTER — Ambulatory Visit (HOSPITAL_COMMUNITY)
Admission: EM | Admit: 2019-08-16 | Discharge: 2019-08-16 | Disposition: A | Discharge status: Home / Self Care | Payer: Medicaid Other | Attending: Emergency Medicine | Admitting: Emergency Medicine

## 2019-08-16 DIAGNOSIS — L089 Local infection of the skin and subcutaneous tissue, unspecified: Secondary | ICD-10-CM

## 2019-08-16 DIAGNOSIS — S0183XA Puncture wound without foreign body of other part of head, initial encounter: Secondary | ICD-10-CM | POA: Diagnosis not present

## 2019-08-16 DIAGNOSIS — R590 Localized enlarged lymph nodes: Secondary | ICD-10-CM

## 2019-08-16 MED ORDER — AMOXICILLIN-POT CLAVULANATE 875-125 MG PO TABS: 1.0000 | ORAL_TABLET | Freq: Two times a day (BID) | ORAL | 0 refills | Status: AC

## 2019-08-16 NOTE — ED Provider Notes (Addendum)
[facility]    CSN: [id number] Arrival date & time: 08/16/19  1256      History   Chief Complaint Chief Complaint  Patient presents with  . Swollen Chin    HPI Julie Cherry is a 18 y.o. female.   Patient presents with swelling under her chin x2 days.  She states this started after she had her gum above her upper front teeth pierced.  She denies fever, chills, difficulty swallowing, sore throat, or other symptoms.  LMP: 08/15/2019.  The history is provided by the patient.    Past Medical History:  Diagnosis Date  . Asthma    Last had symptoms age 89.   . 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 Pyelonephritis 05/27/2016    Patient Active Problem List   Diagnosis Date Noted  . At risk for sexually transmitted disease due to unprotected sex 08/06/2019  . Chlamydia 07/09/2019  . Breakthrough bleeding on Nexplanon 07/19/2017  . Vitamin D deficiency 01/20/2016  . Heart murmur 12/02/2013  . Vaccination not carried out because of caregiver refusal 09/10/2013  . Allergic rhinitis 09/10/2013    Past Surgical History:  Procedure Laterality Date  . MOUTH SURGERY    . TOOTH EXTRACTION  Aug 2014   severe decay    OB History   No obstetric history on file.      Home Medications    Prior to Admission medications   Medication Sig Start Date End Date Taking? Authorizing Provider  amoxicillin-clavulanate (AUGMENTIN) 875-125 MG tablet Take 1 tablet by mouth every 12 (twelve) hours for 10 days. 08/16/19 08/26/19  Mickie Bailate, Izeyah Deike H, NP  Cholecalciferol (VITAMIN D) 50 MCG (2000 UT) tablet Take one tablet daily 07/09/19   Gregor Hamsebben, Jacqueline, NP    Family History Family History  Problem Relation Age of Onset  . Asthma Father   . Diabetes Paternal Grandfather     Social History Social History   Tobacco Use  . Smoking status: Never Smoker  . Smokeless tobacco: Never Used  Substance Use Topics  . Alcohol use: No  . Drug use: No     Allergies    Pork-derived products   Review of Systems Review of Systems  Constitutional: Negative for chills and fever.  HENT: Negative for ear pain, sore throat and trouble swallowing.   Eyes: Negative for pain and visual disturbance.  Respiratory: Negative for cough and shortness of breath.   Cardiovascular: Negative for chest pain and palpitations.  Gastrointestinal: Negative for abdominal pain and vomiting.  Genitourinary: Negative for dysuria and hematuria.  Musculoskeletal: Negative for arthralgias and back pain.  Skin: Negative for color change and rash.  Neurological: Negative for seizures and syncope.  All other systems reviewed and are negative.    Physical Exam Triage Vital Signs ED Triage Vitals  Enc Vitals Group     BP 08/16/19 1401 (!) 105/59     Pulse Rate 08/16/19 1401 62     Resp 08/16/19 1401 18     Temp 08/16/19 1401 98.7 F (37.1 C)     Temp Source 08/16/19 1401 Oral     SpO2 --      Weight --      Height --      Head Circumference --      Peak Flow --      Pain Score 08/16/19 1402 4     Pain Loc --      Pain Edu? --      Excl. in [place]? --  No data found.  Updated Vital Signs BP (!) 105/59 (BP Location: Right Arm)   Pulse 62   Temp 98.7 F (37.1 C) (Oral)   Resp 18   LMP 08/15/2019   Visual Acuity Right Eye Distance:   Left Eye Distance:   Bilateral Distance:    Right Eye Near:   Left Eye Near:    Bilateral Near:     Physical Exam Vitals signs and nursing note reviewed.  Constitutional:      General: She is not in acute distress.    Appearance: She is well-developed.  HENT:     Head: Normocephalic and atraumatic.     Right Ear: Tympanic membrane normal.     Left Ear: Tympanic membrane normal.     Nose: Nose normal.     Mouth/Throat:     Mouth: Mucous membranes are moist.     Comments: Gum erythematous at piercing in upper gum above front teeth. Eyes:     Conjunctiva/sclera: Conjunctivae normal.  Neck:     Musculoskeletal: Neck supple.      Comments: Tender, enlarged submental lymph node.   No other enlarged nodes. Cardiovascular:     Rate and Rhythm: Normal rate and regular rhythm.     Heart sounds: No murmur.  Pulmonary:     Effort: Pulmonary effort is normal. No respiratory distress.     Breath sounds: Normal breath sounds.  Abdominal:     General: Bowel sounds are normal.     Palpations: Abdomen is soft.     Tenderness: There is no abdominal tenderness. There is no guarding or rebound.  Skin:    General: Skin is warm and dry.     Findings: No rash.  Neurological:     General: No focal deficit present.     Mental Status: She is alert and oriented to person, place, and time.     Sensory: No sensory deficit.     Motor: No weakness.  Psychiatric:        Mood and Affect: Mood normal.        Behavior: Behavior normal.      UC Treatments / Results  Labs (all labs ordered are listed, but only abnormal results are displayed) Labs Reviewed - No data to display  EKG   Radiology No results found.  Procedures Procedures (including critical care time)  Medications Ordered in UC Medications - No data to display  Initial Impression / Assessment and Plan / UC Course  I have reviewed the triage vital signs and the nursing notes.  Pertinent labs & imaging results that were available during my care of the patient were reviewed by me and considered in my medical decision making (see chart for details).    Submental lymphadenopathy due to infected gum piercing.  Treating with Augmentin.  Instructed patient to have the piercing removed and to avoid piercing her mouth in the future.  Discussed that she should go to the emergency department if she develops fever, chills, dysphagia, or other symptoms.  Patient agrees to plan of care.     Final Clinical Impressions(s) / UC Diagnoses   Final diagnoses:  Lymphadenopathy, submental  Pierced face infection     Discharge Instructions     Take the antibiotic  Augmentin twice a day for 10 days.    Have the piercing removed from your gum.    Go to the emergency department if you develop fever, chills, difficulty swallowing, or other symptoms.    Avoid mouth piercings  in the future.        ED Prescriptions    Medication Sig Dispense Auth. Provider   amoxicillin-clavulanate (AUGMENTIN) 875-125 MG tablet Take 1 tablet by mouth every 12 (twelve) hours for 10 days. 20 tablet Sharion Balloon, NP     Controlled Substance Prescriptions [place] Controlled Substance Registry consulted? Not Applicable   Sharion Balloon, NP 08/16/19 1442    Sharion Balloon, NP 08/16/19 1443

## 2019-08-16 NOTE — Discharge Instructions (Addendum)
Take the antibiotic Augmentin twice a day for 10 days.    Have the piercing removed from your gum.    Go to the emergency department if you develop fever, chills, difficulty swallowing, or other symptoms.    Avoid mouth piercings in the future.

## 2019-08-16 NOTE — ED Triage Notes (Signed)
Pt presents with swelling to her chin/neck area from unknown source X 2 days.

## 2019-09-26 ENCOUNTER — Ambulatory Visit: Payer: Medicaid Other | Admitting: Podiatry

## 2019-12-09 ENCOUNTER — Encounter (HOSPITAL_COMMUNITY): Payer: Self-pay

## 2019-12-09 ENCOUNTER — Ambulatory Visit (HOSPITAL_COMMUNITY)
Admission: EM | Admit: 2019-12-09 | Discharge: 2019-12-09 | Disposition: A | Discharge status: Home / Self Care | Payer: Medicaid Other | Attending: Emergency Medicine | Admitting: Emergency Medicine

## 2019-12-09 DIAGNOSIS — S30860A Insect bite (nonvenomous) of lower back and pelvis, initial encounter: Secondary | ICD-10-CM | POA: Diagnosis not present

## 2019-12-09 DIAGNOSIS — W57XXXA Bitten or stung by nonvenomous insect and other nonvenomous arthropods, initial encounter: Secondary | ICD-10-CM

## 2019-12-09 MED ORDER — TRIAMCINOLONE ACETONIDE 0.1 % EX CREA: 1.0000 "application " | TOPICAL_CREAM | Freq: Two times a day (BID) | CUTANEOUS | 0 refills | Status: DC

## 2019-12-09 NOTE — ED Triage Notes (Addendum)
Pt. States there are bite marks on her lower days that she has had for 4 days now. States does NOT know what kind of bites marks it is, states it only itches.

## 2019-12-09 NOTE — Discharge Instructions (Signed)
Please apply triamcinolone/kenalog cream twice daily to area to help with itching and inflammation Avoid scratching Follow up if developing signs of infections- increased redness, swelling or pain Monitor for this to gradual resolve If becoming recurrent/ all over body consider evaluating home for bedbugs

## 2019-12-10 NOTE — ED Provider Notes (Signed)
[facility]    CSN: [id number] Arrival date & time: 12/09/19  1054      History   Chief Complaint Chief Complaint  Patient presents with  . Insect Bite    HPI Julie Cherry is a 19 y.o. female history of asthma presenting today for evaluation of possible bug bite.  Patient states that she has had an area of redness and swelling to her left lower back.  Has been present for approximately 4 days.  She is unsure if something bit her.  She denies significant pain.  Mainly associated itching.  She has not applied anything on this area but does feel as if it may be worsening.  She denies any fevers nausea or vomiting.  Denies headaches.  HPI  Past Medical History:  Diagnosis Date  . Asthma    Last had symptoms age 19.   . 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 Pyelonephritis 05/27/2016    Patient Active Problem List   Diagnosis Date Noted  . At risk for sexually transmitted disease due to unprotected sex 08/06/2019  . Chlamydia 07/09/2019  . Breakthrough bleeding on Nexplanon 07/19/2017  . Vitamin D deficiency 01/20/2016  . Heart murmur 12/02/2013  . Vaccination not carried out because of caregiver refusal 09/10/2013  . Allergic rhinitis 09/10/2013    Past Surgical History:  Procedure Laterality Date  . MOUTH SURGERY    . TOOTH EXTRACTION  Aug 2014   severe decay    OB History   No obstetric history on file.      Home Medications    Prior to Admission medications   Medication Sig Start Date End Date Taking? Authorizing Provider  Cholecalciferol (VITAMIN D) 50 MCG (2000 UT) tablet Take one tablet daily 07/09/19   Gregor Hams, NP  triamcinolone cream (KENALOG) 0.1 % Apply 1 application topically 2 (two) times daily. 12/09/19   Toula Miyasaki, Junius Creamer, PA-C    Family History Family History  Problem Relation Age of Onset  . Asthma Father   . Diabetes Paternal Grandfather   . Healthy Mother     Social History Social History   Tobacco Use  .  Smoking status: Never Smoker  . Smokeless tobacco: Never Used  Substance Use Topics  . Alcohol use: No  . Drug use: No     Allergies   Pork-derived products   Review of Systems Review of Systems  Constitutional: Negative for fatigue and fever.  Eyes: Negative for visual disturbance.  Respiratory: Negative for shortness of breath.   Cardiovascular: Negative for chest pain.  Gastrointestinal: Negative for abdominal pain, nausea and vomiting.  Musculoskeletal: Negative for arthralgias and joint swelling.  Skin: Positive for color change and rash. Negative for wound.  Neurological: Negative for dizziness, weakness, light-headedness and headaches.     Physical Exam Triage Vital Signs ED Triage Vitals  Enc Vitals Group     BP 12/09/19 1204 98/67     Pulse Rate 12/09/19 1204 68     Resp 12/09/19 1204 17     Temp 12/09/19 1204 98.7 F (37.1 C)     Temp Source 12/09/19 1204 Oral     SpO2 12/09/19 1204 100 %     Weight 12/09/19 1202 127 lb 12.8 oz (58 kg)     Height --      Head Circumference --      Peak Flow --      Pain Score 12/09/19 1202 0     Pain Loc --  Pain Edu? --      Excl. in [place]? --    No data found.  Updated Vital Signs BP 98/67 (BP Location: Left Arm)   Pulse 68   Temp 98.7 F (37.1 C) (Oral)   Resp 17   Wt 127 lb 12.8 oz (58 kg)   LMP 11/17/2019   SpO2 100%   BMI 23.00 kg/m   Visual Acuity Right Eye Distance:   Left Eye Distance:   Bilateral Distance:    Right Eye Near:   Left Eye Near:    Bilateral Near:     Physical Exam Vitals and nursing note reviewed.  Constitutional:      Appearance: She is well-developed.     Comments: No acute distress  HENT:     Head: Normocephalic and atraumatic.     Nose: Nose normal.  Eyes:     Conjunctiva/sclera: Conjunctivae normal.  Cardiovascular:     Rate and Rhythm: Normal rate.  Pulmonary:     Effort: Pulmonary effort is normal. No respiratory distress.  Abdominal:     General: There is no  distension.  Musculoskeletal:        General: Normal range of motion.     Cervical back: Neck supple.  Skin:    General: Skin is warm and dry.     Comments: Left lower back with area of 3 punctate lesions with surrounding erythema and slightly raised, no induration or fluctuance, no erythema extending outward, no increased warmth, nontender to touch  Neurological:     Mental Status: She is alert and oriented to person, place, and time.      UC Treatments / Results  Labs (all labs ordered are listed, but only abnormal results are displayed) Labs Reviewed - No data to display  EKG   Radiology No results found.  Procedures Procedures (including critical care time)  Medications Ordered in UC Medications - No data to display  Initial Impression / Assessment and Plan / UC Course  I have reviewed the triage vital signs and the nursing notes.  Pertinent labs & imaging results that were available during my care of the patient were reviewed by me and considered in my medical decision making (see chart for details).     Lesions are suggestive of likely insect bites.  Will provide triamcinolone to apply topically to help with local inflammation and itching.  Avoid scratching, monitor for development of secondary infection.  Only lesions on body, advised if becoming recurrent or diffusely on body consider evaluations of bedbugs given this linear distribution of bites.  Discussed strict return precautions. Patient verbalized understanding and is agreeable with plan.  Final Clinical Impressions(s) / UC Diagnoses   Final diagnoses:  Insect bite of lower back, initial encounter     Discharge Instructions     Please apply triamcinolone/kenalog cream twice daily to area to help with itching and inflammation Avoid scratching Follow up if developing signs of infections- increased redness, swelling or pain Monitor for this to gradual resolve If becoming recurrent/ all over body  consider evaluating home for bedbugs   ED Prescriptions    Medication Sig Dispense Auth. Provider   triamcinolone cream (KENALOG) 0.1 % Apply 1 application topically 2 (two) times daily. 30 g Nathaneil Feagans, [place] C, PA-C     PDMP not reviewed this encounter.   Janith Lima, [place] 12/10/19 1242

## 2019-12-29 ENCOUNTER — Telehealth: Payer: Medicaid Other | Admitting: Pediatrics

## 2019-12-31 ENCOUNTER — Encounter: Payer: Self-pay | Admitting: Pediatrics

## 2019-12-31 ENCOUNTER — Other Ambulatory Visit: Payer: Self-pay

## 2019-12-31 ENCOUNTER — Telehealth: Payer: Medicaid Other | Admitting: Pediatrics

## 2020-02-13 ENCOUNTER — Telehealth: Payer: Self-pay | Admitting: Pediatrics

## 2020-02-13 NOTE — Telephone Encounter (Signed)
Pre-screening for onsite visit ° °1. Who is bringing the patient to the visit? Patient  ° °Informed only one adult can bring patient to the visit to limit possible exposure to COVID19 and facemasks must be worn while in the building by the patient (ages 2 and older) and adult. ° °2. Has the person bringing the patient or the patient been around anyone with suspected or confirmed COVID-19 in the last 14 days? No ° °3. Has the person bringing the patient or the patient been around anyone who has been tested for COVID-19 in the last 14 days? {No ° °4. Has the person bringing the patient or the patient had any of these symptoms in the last 14 days? No  ° °Fever (temp 100 F or higher) °Breathing problems °Cough °Sore throat °Body aches °Chills °Vomiting °Diarrhea °Loss of taste or smell ° ° °If all answers are negative, advise patient to call our office prior to your appointment if you or the patient develop any of the symptoms listed above. °  °If any answers are yes, cancel in-office visit and schedule the patient for a same day telehealth visit with a provider to discuss the next steps. °

## 2020-02-16 ENCOUNTER — Other Ambulatory Visit (HOSPITAL_COMMUNITY)
Admission: RE | Admit: 2020-02-16 | Discharge: 2020-02-16 | Disposition: A | Discharge status: Home / Self Care | Payer: Medicaid Other | Source: Ambulatory Visit | Attending: Pediatrics | Admitting: Pediatrics

## 2020-02-16 ENCOUNTER — Ambulatory Visit (INDEPENDENT_AMBULATORY_CARE_PROVIDER_SITE_OTHER): Payer: Medicaid Other | Admitting: Pediatrics

## 2020-02-16 ENCOUNTER — Encounter: Payer: Self-pay | Admitting: Pediatrics

## 2020-02-16 ENCOUNTER — Other Ambulatory Visit: Payer: Self-pay

## 2020-02-16 VITALS — BP 101/61 | HR 63 | Ht 63.0 in | Wt 127.4 lb

## 2020-02-16 DIAGNOSIS — Z30013 Encounter for initial prescription of injectable contraceptive: Secondary | ICD-10-CM

## 2020-02-16 DIAGNOSIS — Z113 Encounter for screening for infections with a predominantly sexual mode of transmission: Secondary | ICD-10-CM

## 2020-02-16 DIAGNOSIS — M79672 Pain in left foot: Secondary | ICD-10-CM

## 2020-02-16 DIAGNOSIS — Z3046 Encounter for surveillance of implantable subdermal contraceptive: Secondary | ICD-10-CM | POA: Diagnosis not present

## 2020-02-16 DIAGNOSIS — Q669 Congenital deformity of feet, unspecified, unspecified foot: Secondary | ICD-10-CM | POA: Insufficient documentation

## 2020-02-16 LAB — POCT RAPID HIV: Rapid HIV, POC: NEGATIVE

## 2020-02-16 MED ORDER — MEDROXYPROGESTERONE ACETATE 150 MG/ML IM SUSP
150.0000 mg | Freq: Once | INTRAMUSCULAR | Status: AC
  Administered 2020-02-16: 16:00:00 150 mg via INTRAMUSCULAR

## 2020-02-16 NOTE — Progress Notes (Signed)
  Subjective:     Patient ID: [id number] Julie Cherry, female   DOB: 2001-10-04, 19 y.o.   MRN: [id number]  HPI:  19 year old female seen earlier this afternoon for removal of her Nexplanon.  She will be using Depot-Provera injections for birth control.  Julie Cherry has toe deformity on left foot.  Lately she has been having pain on the bottom of her foot that can extend to her heel.  When it is hurting, she has difficulty walking.  She wears either sneakers or bedroom slippers during the day.   Review of Systems:  Non-contributory except as mentioned in HPI     Objective:   Physical Exam Musculoskeletal:     Comments: Short 3rd left toe- same length as 4th toe.  No swelling, redness or tenderness of toe.  Foot has normal arch, no corns or calluses and no areas of tenderness. FROM to foot and ankle.  Normal gait.          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   Gregor Hams, PPCNP-BC

## 2020-02-16 NOTE — Patient Instructions (Signed)
Follow-up in 1 month. Schedule this appointment before you leave clinic today.  Your Nexplanon was removed today and was replaced with another  If you have sex, remember to use condoms to prevent pregnancy and to prevent sexually transmitted infections.  Leave the outside bandage on for 24 hours.  Leave the smaller bandages on for 3-5 days or until they fall off on their own.  Keep the area clean and dry for 3-5 days.  There is usually bruising or swelling at and around the removal site for a few days to a week after the removal.  If you see redness or pus draining from the removal site, call us immediately.  We would like you to return to the clinic for a follow-up visit in 1 month.  You can call [facility] 24 hours a day with any questions or concerns.  There is always a nurse or doctor available to take your call.  Call 9-1-1 if you have a life-threatening emergency.  For anything else, please call us at [phone] before heading to the ER.  +++++++++++++++++++++++++++++++++++++++++++++  Congratulations on getting your Nexplanon placement!  Below is some important information about Nexplanon.  First remember that Nexplanon does not prevent sexually transmitted infections.  Condoms will help prevent sexually transmitted infections. The Nexplanon starts working 7 days after it was inserted.  There is a risk of getting pregnant if you have unprotected sex in those first 7 days after placement of the Nexplanon.  The Nexplanon lasts for 3 years but can be removed at any time.  You can become pregnant as early as 1 week after removal.  You can have a new Nexplanon put in after the old one is removed if you like.  It is not known whether Nexplanon is as effective in women who are very overweight because the studies did not include many overweight women.  Nexplanon interacts with some medications, including barbiturates, bosentan, carbamazepine, felbamate, griseofulvin,  oxcarbazepine, phenytoin, rifampin, St. John's wort, topiramate, HIV medicines.  Please alert your doctor if you are on any of these medicines.  Always tell other healthcare providers that you have a Nexplanon in your arm.  The Nexplanon was placed just under the skin.  Leave the outside bandage on for 24 hours.  Leave the smaller bandage on for 3-5 days or until it falls off on its own.  Keep the area clean and dry for 3-5 days. There is usually bruising or swelling at the insertion site for a few days to a week after placement.  If you see redness or pus draining from the insertion site, call us immediately.  Keep your user card with the date the implant was placed and the date the implant is to be removed.  The most common side effect is a change in your menstrual bleeding pattern.   This bleeding is generally not harmful to you but can be annoying.  Call or come in to see [place] if you have any concerns about the bleeding or if you have any side effects or questions.

## 2020-02-16 NOTE — Progress Notes (Signed)
Adolescent Medicine Clinic Follow Up Note  History was provided by the patient.  Julie Cherry is a 19 y.o. female who is here for Nexplanon removal and insertion.  Julie Hams, NP   HPI:   [phone] Julie Cherry presents today for nexplanon removal and replacement. Her Nexplanon was placed on 01/18/2017 in her left arm. Last time she was seen in this clinic was 06/2018, at which time she was treated for STDs  Had some breakthrough bleeding on nexplanon, took two weeks of OCPs a few years back and this took care of it. Otherwise, no issues with the nexplanon. Has had complete menstrual suppresison. She is unsure of what method she would like to use next for contraception, but is amenable to getting depo today as a bridge.   No vaginal bleeding, itching, or discharge. Would like to get STD testing today anyway.   Has an issue with her toes that she would like to get addressed with PCP is able. Julie Cherry has an appointment open this afternoon -- will send her there after nexplanon removal.  No LMP recorded.  Review of Systems negative except where noted above   Patient Active Problem List   Diagnosis Date Noted  . At risk for sexually transmitted disease due to unprotected sex 08/06/2019  . Breakthrough bleeding on Nexplanon 07/19/2017  . Vitamin D deficiency 01/20/2016  . Heart murmur 12/02/2013  . Vaccination not carried out because of caregiver refusal 09/10/2013  . Allergic rhinitis 09/10/2013    Current Outpatient Medications on File Prior to Visit  Medication Sig Dispense Refill  . Cholecalciferol (VITAMIN D) 50 MCG (2000 UT) tablet Take one tablet daily (Patient not taking: Reported on 02/16/2020) 30 tablet 11  . triamcinolone cream (KENALOG) 0.1 % Apply 1 application topically 2 (two) times daily. (Patient not taking: Reported on 02/16/2020) 30 g 0   No current facility-administered medications on file prior to visit.    Allergies  Allergen Reactions  .  Pork-Derived Products     Patient does not eat pork but can have meds/products with pork     Social History: Confidentiality was discussed with the patient and if applicable, with caregiver as well. Sexually active? yes - one female partner  Safety: uses condoms Last STI Screening:last fall Pregnancy Prevention: Nexplanon -- remove today and give Depo  Physical Exam:    Vitals:   02/16/20 1500  Weight: 127 lb 6.4 oz (57.8 kg)  Height: 5\' 3"  (1.6 m)    Growth percentile SmartLinks can only be used for patients less than 63 years old.  Physical Exam Vitals and nursing note reviewed.  Constitutional:      General: She is not in acute distress.    Appearance: Normal appearance. She is well-developed.  HENT:     Head: Normocephalic and atraumatic.     Right Ear: External ear normal.     Left Ear: External ear normal.     Mouth/Throat:     Mouth: Mucous membranes are moist.  Eyes:     General:        Right eye: No discharge.        Left eye: No discharge.     Conjunctiva/sclera: Conjunctivae normal.     Pupils: Pupils are equal, round, and reactive to light.  Cardiovascular:     Rate and Rhythm: Normal rate and regular rhythm.     Heart sounds: Normal heart sounds. No murmur.  Pulmonary:     Effort: Pulmonary effort is normal.  Breath sounds: Normal breath sounds. No wheezing or rales.  Musculoskeletal:        General: No deformity.     Cervical back: Normal range of motion.  Skin:    General: Skin is warm and dry.     Capillary Refill: Capillary refill takes less than 2 seconds.     Coloration: Skin is not pale.     Findings: No rash.     Comments: Nexplanon in place in the medial aspect of the L arm. Prior scar site well healed. See procedure note below.   Neurological:     Mental Status: She is alert and oriented to person, place, and time.     Deep Tendon Reflexes: Reflexes are normal and symmetric.  Psychiatric:        Behavior: Behavior normal.    Results  for orders placed or performed in visit on 02/16/20 (from the past 24 hour(s))  POCT Rapid HIV     Status: Normal   Collection Time: 02/16/20  4:22 PM  Result Value Ref Range   Rapid HIV, POC Negative    PROCEDURES  Risks & benefits of Nexplanon removal discussed. Consent form signed.  The patient denies any allergies to anesthetics or antiseptics.  Procedure: Pt was placed in supine position. left arm was flexed at the elbow and externally rotated so that her wrist was parallel to her ear, The device was palpated and marked. The site was cleaned with Betadine. The area surrounding the device was covered with a sterile drape. 1% lidocaine was injected just under the device. A scalpel was used to create a small incision. The device was pushed towards the incision. Fibrous tissue surrounding the device was gradually removed from the device. The device was removed and measured to ensure all 4 cm of device was removed. Steri-strips were used to close the incision. Pressure dressing was applied to the patient.  The patient was instructed to removed the pressure dressing in 24 hrs.  The patient was advised to move slowly from a supine to an upright position  The patient denied any concerns or complaints  The patient was instructed to schedule a follow-up appt in 1 month. The patient will be called in 1 week to address any concerns.  __________  Assessment/Plan:  [name] is a 19 y.o. female who presents for nexplanon removal today. Will give depo provera and also test for STDs per patient request. PCP to f/u foot issues.   1. Encounter for Nexplanon removal 2. Encounter for initial prescription of injectable contraceptive - Nexplanon removed today  - will give depo as a bridge - patient unsure of long term contraception option. Different methods briefly reviewed today. Handouts given. Patient amenable to RTC in 1 month to discuss future options - one female  partner, testing per below.  - medroxyPROGESTERone (DEPO-PROVERA) injection 150 mg  3. Screening examination for STD (sexually transmitted disease) - Asymptomatic today, though would like testing - offered RPR with HIV antibody, though patient politely declined  - I told the patient that we would call her with any abnormal results - C. trachomatis/N. gonorrhoeae RNA - POCT Rapid HIV  No follow-ups on file.

## 2020-02-17 DIAGNOSIS — Z20828 Contact with and (suspected) exposure to other viral communicable diseases: Secondary | ICD-10-CM | POA: Diagnosis not present

## 2020-02-17 NOTE — Progress Notes (Signed)
I have reviewed the resident's note and plan of care and helped develop the plan as necessary.  Pt declined reinsertion of nexplanon today, but depo was given so she has time to consider potentially another LARC. I was immediately available for procedure as documented by resident.   Alfonso Ramus, FNP

## 2020-02-18 LAB — URINE CYTOLOGY ANCILLARY ONLY
Chlamydia: NEGATIVE
Comment: NEGATIVE
Comment: NORMAL
Neisseria Gonorrhea: NEGATIVE

## 2020-03-15 ENCOUNTER — Encounter (HOSPITAL_COMMUNITY): Payer: Self-pay | Admitting: Emergency Medicine

## 2020-03-15 ENCOUNTER — Other Ambulatory Visit: Payer: Self-pay

## 2020-03-15 ENCOUNTER — Ambulatory Visit (INDEPENDENT_AMBULATORY_CARE_PROVIDER_SITE_OTHER): Payer: Medicaid Other | Admitting: Pediatrics

## 2020-03-15 ENCOUNTER — Emergency Department (HOSPITAL_COMMUNITY)
Admission: EM | Admit: 2020-03-15 | Discharge: 2020-03-15 | Disposition: A | Discharge status: Home / Self Care | Payer: Medicaid Other | Attending: Emergency Medicine | Admitting: Emergency Medicine

## 2020-03-15 VITALS — BP 110/67 | HR 81 | Ht 63.0 in | Wt 125.3 lb

## 2020-03-15 DIAGNOSIS — J45909 Unspecified asthma, uncomplicated: Secondary | ICD-10-CM | POA: Insufficient documentation

## 2020-03-15 DIAGNOSIS — Z3009 Encounter for other general counseling and advice on contraception: Secondary | ICD-10-CM

## 2020-03-15 DIAGNOSIS — N926 Irregular menstruation, unspecified: Secondary | ICD-10-CM | POA: Diagnosis not present

## 2020-03-15 DIAGNOSIS — M79604 Pain in right leg: Secondary | ICD-10-CM | POA: Diagnosis not present

## 2020-03-15 MED ORDER — METHOCARBAMOL 500 MG PO TABS: 500.0000 mg | ORAL_TABLET | Freq: Two times a day (BID) | ORAL | 0 refills | Status: DC

## 2020-03-15 MED ORDER — IBUPROFEN 600 MG PO TABS: 600.0000 mg | ORAL_TABLET | Freq: Four times a day (QID) | ORAL | 0 refills | Status: DC | PRN

## 2020-03-15 NOTE — ED Provider Notes (Signed)
[facility] EMERGENCY DEPARTMENT Provider Note   CSN: [id number] Arrival date & time: 03/15/20  [id number]     History Chief Complaint  Patient presents with  . Leg Pain    Julie Cherry is a 19 y.o. female.  HPI      Julie Cherry is a 19 y.o. female, patient with no pertinent past medical history, presenting to the ED with complaint of right leg pain beginning 2 days ago. She describes the pain as a soreness and a tightness.  The pain originally began in the right buttocks and right upper leg, radiating into the right lower back, sometimes radiating into the right foot.  This is accompanied at times with tingling in the right leg.  Pain began while sitting.  Denies history of PE/DVT, recent surgery, recent trauma.  Patient was seen earlier today for Depo injection, however, states this injection did not take place in the right buttocks. Denies fever/chills, swelling, color change, numbness, weakness, abdominal pain, changes in bowel or bladder function, saddle anesthesias, falls/trauma, chest pain, shortness of breath, or any other complaints.    Past Medical History:  Diagnosis Date  . Asthma    Last had symptoms age 38.   . Chlamydia 07/09/2019  . 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 Pyelonephritis 05/27/2016    Patient Active Problem List   Diagnosis Date Noted  . 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 Left foot pain 02/16/2020  . At risk for sexually transmitted disease due to unprotected sex 08/06/2019  . Breakthrough bleeding on Nexplanon 07/19/2017  . Vitamin D deficiency 01/20/2016  . Heart murmur 12/02/2013  . Vaccination not carried out because of caregiver refusal 09/10/2013  . Allergic rhinitis 09/10/2013    Past Surgical History:  Procedure Laterality Date  . MOUTH SURGERY    . TOOTH EXTRACTION  Aug 2014   severe decay     OB History   No obstetric history on file.     Family History  Problem  Relation Age of Onset  . Asthma Father   . Diabetes Paternal Grandfather   . Healthy Mother     Social History   Tobacco Use  . Smoking status: Never Smoker  . Smokeless tobacco: Never Used  Substance Use Topics  . Alcohol use: No  . Drug use: No    Home Medications Prior to Admission medications   Medication Sig Start Date End Date Taking? Authorizing Provider  Cholecalciferol (VITAMIN D) 50 MCG (2000 UT) tablet Take one tablet daily Patient not taking: Reported on 02/16/2020 07/09/19   Gregor Hams, NP  ibuprofen (ADVIL) 600 MG tablet Take 1 tablet (600 mg total) by mouth every 6 (six) hours as needed. 03/15/20   Tylor Gambrill C, PA-C  methocarbamol (ROBAXIN) 500 MG tablet Take 1 tablet (500 mg total) by mouth 2 (two) times daily. 03/15/20   Gerianne Simonet C, PA-C  triamcinolone cream (KENALOG) 0.1 % Apply 1 application topically 2 (two) times daily. Patient not taking: Reported on 02/16/2020 12/09/19   Wieters, Fran Lowes C, PA-C    Allergies    Pork-derived products  Review of Systems   Review of Systems  Constitutional: Negative for fever.  Respiratory: Negative for cough and shortness of breath.   Cardiovascular: Negative for chest pain.  Gastrointestinal: Negative for abdominal pain, nausea and vomiting.  Genitourinary: Negative for difficulty urinating.  Musculoskeletal: Positive for back pain and myalgias.  Neurological: Negative for weakness and numbness.  All other systems reviewed  and are negative.   Physical Exam Updated Vital Signs BP 117/69   Pulse 66   Temp 98.9 F (37.2 C) (Oral)   Resp 16   Ht 5\' 3"  (1.6 m)   Wt 56.7 kg   SpO2 100%   BMI 22.14 kg/m   Physical Exam Vitals and nursing note reviewed.  Constitutional:      General: She is not in acute distress.    Appearance: She is well-developed. She is not diaphoretic.  HENT:     Head: Normocephalic and atraumatic.     Mouth/Throat:     Mouth: Mucous membranes are moist.     Pharynx: Oropharynx is  clear.  Eyes:     Conjunctiva/sclera: Conjunctivae normal.  Cardiovascular:     Rate and Rhythm: Normal rate and regular rhythm.     Pulses: Normal pulses.          Radial pulses are 2+ on the right side and 2+ on the left side.       Dorsalis pedis pulses are 2+ on the right side and 2+ on the left side.       Posterior tibial pulses are 2+ on the right side and 2+ on the left side.     Heart sounds: Normal heart sounds.     Comments: Tactile temperature in the extremities appropriate and equal bilaterally. Pulmonary:     Effort: Pulmonary effort is normal. No respiratory distress.     Breath sounds: Normal breath sounds.  Abdominal:     Palpations: Abdomen is soft.     Tenderness: There is no abdominal tenderness. There is no guarding.  Musculoskeletal:     Cervical back: Neck supple.       Back:     Right lower leg: No edema.     Left lower leg: No edema.     Comments: Pain in the right quadricep with extension at the knee.  Some tenderness in the quadricep, but no pain out of proportion, no swelling, color change, increased warmth.  Full range of motion in the right hip, knee, and ankle.  She performs this range of motion without any noted hesitation.  No pain, tenderness, swelling, color change, or temperature abnormality throughout the rest of the right leg.   Lymphadenopathy:     Cervical: No cervical adenopathy.  Skin:    General: Skin is warm and dry.     Capillary Refill: Capillary refill takes less than 2 seconds.  Neurological:     Mental Status: She is alert.     Comments: Sensation grossly intact to light touch in the lower extremities bilaterally. No saddle anesthesias. Strength 5/5 in the bilateral lower extremities. Antalgic, but stable gait. Coordination intact.  Psychiatric:        Mood and Affect: Mood and affect normal.        Speech: Speech normal.        Behavior: Behavior normal.     ED Results / Procedures / Treatments   Labs (all labs ordered  are listed, but only abnormal results are displayed) Labs Reviewed - No data to display  EKG None  Radiology No results found.  Procedures Procedures (including critical care time)  Medications Ordered in ED Medications - No data to display  ED Course  I have reviewed the triage vital signs and the nursing notes.  Pertinent labs & imaging results that were available during my care of the patient were reviewed by me and considered in my medical decision  making (see chart for details).    MDM Rules/Calculators/A&P                      Patient presents with right leg pain.  She does have some involvement of the back.  Symptoms could be indicative of sciatica. Alternative pathology, such as DVT, was considered, but thought less likely due to lack of risk factors and exam is not suggestive. She will follow-up with her PCP for any further evaluation or management. The patient was given instructions for home care as well as return precautions. Patient voices understanding of these instructions, accepts the plan, and is comfortable with discharge.   Final Clinical Impression(s) / ED Diagnoses Final diagnoses:  Right leg pain    Rx / DC Orders ED Discharge Orders         Ordered    methocarbamol (ROBAXIN) 500 MG tablet  2 times daily     03/15/20 1221    ibuprofen (ADVIL) 600 MG tablet  Every 6 hours PRN     03/15/20 1221           Anselm Pancoast, PA-C 03/15/20 1504    Pricilla Loveless, MD 03/17/20 [phone]

## 2020-03-15 NOTE — ED Notes (Signed)
Patient verbalizes understanding of discharge instructions. Opportunity for questioning and answers were provided. Armband removed by staff. Patient discharged from ED.  

## 2020-03-15 NOTE — Discharge Instructions (Signed)

## 2020-03-15 NOTE — Progress Notes (Signed)
THIS RECORD MAY CONTAIN CONFIDENTIAL INFORMATION THAT SHOULD NOT BE RELEASED WITHOUT REVIEW OF THE SERVICE PROVIDER.  Adolescent Medicine Consultation Follow-Up Visit Julie Cherry  is a 19 y.o. female referred by Gregor Hams, NP here today for follow-up.    Previsit planning completed:  yes  Growth Chart Viewed? not applicable   History was provided by the patient.  PCP Confirmed?  yes  My Chart Activated?   yes   HPI:    19 yo F presenting for follow up for birth control, last seen 02/16/20 for nexplanon removal and received depo bridge at that time, plan to think about options  Today she reports she would like to stick with depo. Reports there was not anything she did not like about nexplanon, not interested in IUD at this time, prefers depo. No questions about the options. Has not had any new partners or unprotected sex, declines any additional STI screening at this time. Has not restarted menstrual cycle since discontinuing nexplanon.    No LMP recorded. Allergies  Allergen Reactions  . Pork-Derived Products     Patient does not eat pork but can have meds/products with pork    Outpatient Medications Prior to Visit  Medication Sig Dispense Refill  . Cholecalciferol (VITAMIN D) 50 MCG (2000 UT) tablet Take one tablet daily (Patient not taking: Reported on 02/16/2020) 30 tablet 11  . triamcinolone cream (KENALOG) 0.1 % Apply 1 application topically 2 (two) times daily. (Patient not taking: Reported on 02/16/2020) 30 g 0   No facility-administered medications prior to visit.     Patient Active Problem List   Diagnosis Date Noted  . 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 Left foot pain 02/16/2020  . At risk for sexually transmitted disease due to unprotected sex 08/06/2019  . Breakthrough bleeding on Nexplanon 07/19/2017  . Vitamin D deficiency 01/20/2016  . Heart murmur 12/02/2013  . Vaccination not carried out because of caregiver refusal  09/10/2013  . Allergic rhinitis 09/10/2013         Physical Exam:  Vitals:   03/15/20 0858  BP: 110/67  Pulse: 81  Weight: 125 lb 4.8 oz (56.8 kg)  Height: 5\' 3"  (1.6 m)   BP 110/67   Pulse 81   Ht 5\' 3"  (1.6 m)   Wt 125 lb 4.8 oz (56.8 kg)   BMI 22.20 kg/m  Body mass index: body mass index is 22.2 kg/m. Growth percentile SmartLinks can only be used for patients less than 52 years old.  Physical Exam  Gen: well developed, well nourished, pleasant and interactive HENT: sclera clear, no eye drainage, wearing mask Neck: no lymphadenopathy, no thyromegaly CV: RRR, no murmurs Chest: CTAB, no increased WOB Abd: soft, NTND Skin: no notable rashes Neuro: awake, alert, moves all extremities   Assessment/Plan:  1. Birth control counseling - previously on nexplanon, removed one month ago and received depo bridge, upon further discussion today would like to stick with depo - discussed risks and benefits and counseled about other options - use condoms to protect against STIs, last tested one month ago and was negative, declined further testing today, no new risks - return in about 2 months for next depo injection   Follow-up:  For next depo shot  Medical decision-making:  > 10 minutes spent, more than 50% of appointment was spent discussing diagnosis and management of symptoms   , MD [facility] Pediatric[facility] PGY3

## 2020-03-15 NOTE — ED Triage Notes (Signed)
Onset 2 days ago developed pain right 4th and 5th toe radiating to right hip. No trauma.

## 2020-03-15 NOTE — Patient Instructions (Signed)
Please return in about 2 months for your next depo injection  Always wear condoms to prevent sexually transmitted infections   If you would like to switch birth control options, please call clinic or let us know at your next visit

## 2020-03-16 NOTE — Progress Notes (Signed)
I have reviewed the resident's note and plan of care and helped develop the plan as necessary.  Pt satisfied with depo contraception. Will schedule next visit with RN at the beginning of depo window.

## 2020-03-17 ENCOUNTER — Ambulatory Visit (INDEPENDENT_AMBULATORY_CARE_PROVIDER_SITE_OTHER): Payer: Medicaid Other | Admitting: Podiatry

## 2020-03-17 ENCOUNTER — Other Ambulatory Visit: Payer: Self-pay

## 2020-03-17 ENCOUNTER — Ambulatory Visit (INDEPENDENT_AMBULATORY_CARE_PROVIDER_SITE_OTHER): Payer: Medicaid Other

## 2020-03-17 DIAGNOSIS — M216X2 Other acquired deformities of left foot: Secondary | ICD-10-CM

## 2020-03-17 DIAGNOSIS — Q72892 Other reduction defects of left lower limb: Secondary | ICD-10-CM

## 2020-03-19 DIAGNOSIS — M79604 Pain in right leg: Secondary | ICD-10-CM | POA: Diagnosis not present

## 2020-03-24 NOTE — Progress Notes (Signed)
   HPI: 19 y.o. female presenting today as a new patient with a chief complaint of dull aching pain to the left third toe that has been ongoing for the past 9 years. She states the toe is shorter than the rest of her toes. Wearing certain shoes and walking increases the pain. She has not had any treatment for her symptoms. Patient is here for further evaluation and treatment.   Past Medical History:  Diagnosis Date  . Asthma    Last had symptoms age 44.   . Chlamydia 07/09/2019  . 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 Pyelonephritis 05/27/2016     Physical Exam: General: The patient is alert and oriented x3 in no acute distress.  Dermatology: Skin is warm, dry and supple bilateral lower extremities. Negative for open lesions or macerations.  Vascular: Palpable pedal pulses bilaterally. No edema or erythema noted. Capillary refill within normal limits.  Neurological: Epicritic and protective threshold grossly intact bilaterally.   Musculoskeletal Exam: Range of motion within normal limits to all pedal and ankle joints bilateral. Muscle strength 5/5 in all groups bilateral.   Radiographic Exam:  Shortened third metatarsal noted. Normal osseous mineralization. Joint spaces preserved. No fracture/dislocation/boney destruction.    Assessment: 1. Brachymet left 3rd   Plan of Care:  1. Patient evaluated. X-Rays reviewed.  2. Surgical vs conservative treatment was discussed.  3. Recommended the patient think about pros and cons of surgery prior to proceeding.  4. Return to clinic as needed if patient would like to proceed with surgery.     Felecia Shelling, DPM [facility]  Dr. Felecia Shelling, DPM    [address] [place], [place] [address]                Office [phone]  Fax [phone]

## 2020-04-14 ENCOUNTER — Other Ambulatory Visit: Payer: Self-pay

## 2020-04-14 ENCOUNTER — Ambulatory Visit (INDEPENDENT_AMBULATORY_CARE_PROVIDER_SITE_OTHER): Payer: Medicaid Other | Admitting: Podiatry

## 2020-04-14 DIAGNOSIS — Q72892 Other reduction defects of left lower limb: Secondary | ICD-10-CM

## 2020-04-14 DIAGNOSIS — Q6689 Other  specified congenital deformities of feet: Secondary | ICD-10-CM

## 2020-04-14 NOTE — Patient Instructions (Signed)
Pre-Operative Instructions  Congratulations, you have decided to take an important step towards improving your quality of life.  You can be assured that the doctors and staff at [facility] will be with you every step of the way.  Here are some important things you should know:  1. Plan to be at the surgery center/hospital at least 1 (one) hour prior to your scheduled time, unless otherwise directed by the surgical center/hospital staff.  You must have a responsible adult accompany you, remain during the surgery and drive you home.  Make sure you have directions to the surgical center/hospital to ensure you arrive on time. 2. If you are having surgery at [facility] or [facility], you will need a copy of your medical history and physical form from your family physician within one month prior to the date of surgery. We will give you a form for your primary physician to complete.  3. We make every effort to accommodate the date you request for surgery.  However, there are times where surgery dates or times have to be moved.  We will contact you as soon as possible if a change in schedule is required.   4. No aspirin/ibuprofen for one week before surgery.  If you are on aspirin, any non-steroidal anti-inflammatory medications (Mobic, Aleve, Ibuprofen) should not be taken seven (7) days prior to your surgery.  You make take Tylenol for pain prior to surgery.  5. Medications - If you are taking daily heart and blood pressure medications, seizure, reflux, allergy, asthma, anxiety, pain or diabetes medications, make sure you notify the surgery center/hospital before the day of surgery so they can tell you which medications you should take or avoid the day of surgery. 6. No food or drink after midnight the night before surgery unless directed otherwise by surgical center/hospital staff. 7. No alcoholic beverages 24-hours prior to surgery.  No smoking 24-hours prior or 24-hours after  surgery. 8. Wear loose pants or shorts. They should be loose enough to fit over bandages, boots, and casts. 9. Don't wear slip-on shoes. Sneakers are preferred. 10. Bring your boot with you to the surgery center/hospital.  Also bring crutches or a walker if your physician has prescribed it for you.  If you do not have this equipment, it will be provided for you after surgery. 11. If you have not been contacted by the surgery center/hospital by the day before your surgery, call to confirm the date and time of your surgery. 12. Leave-time from work may vary depending on the type of surgery you have.  Appropriate arrangements should be made prior to surgery with your employer. 13. Prescriptions will be provided immediately following surgery by your doctor.  Fill these as soon as possible after surgery and take the medication as directed. Pain medications will not be refilled on weekends and must be approved by the doctor. 14. Remove nail polish on the operative foot and avoid getting pedicures prior to surgery. 15. Wash the night before surgery.  The night before surgery wash the foot and leg well with water and the antibacterial soap provided. Be sure to pay special attention to beneath the toenails and in between the toes.  Wash for at least three (3) minutes. Rinse thoroughly with water and dry well with a towel.  Perform this wash unless told not to do so by your physician.  Enclosed: 1 Ice pack (please put in freezer the night before surgery)   1 Hibiclens skin cleaner     Pre-op instructions  If you have any questions regarding the instructions, please do not hesitate to call our office.  [place]: [address], [place], [place] [address] -- [phone]  [place]: [address] [place], [place] [address] -- [phone]  [place]: [address], [place], [place] [address] -- [phone]   Website: [email or web address] 

## 2020-04-19 ENCOUNTER — Encounter: Payer: Self-pay | Admitting: Pediatrics

## 2020-04-19 NOTE — Progress Notes (Signed)
   HPI: 19 y.o. 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  After discussing with the patient last visit on 414 and explaining the pros and cons of the surgery the patient would like to proceed with surgery.  The brachial metatarsalgia is very symptomatic and she would like to have the metatarsal lengthens out to a normal metatarsal parabola to alleviate her symptoms.  She presents to sign the surgical consent forms and review surgery again.  Past Medical History:  Diagnosis Date  . Asthma    Last had symptoms age 71.   . Chlamydia 07/09/2019  . 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 Pyelonephritis 05/27/2016     Physical Exam: General: The patient is alert and oriented x3 in no acute distress.  Dermatology: Skin is warm, dry and supple bilateral lower extremities. Negative for open lesions or macerations.  Vascular: Palpable pedal pulses bilaterally. No edema or erythema noted. Capillary refill within normal limits.  Neurological: Epicritic and protective threshold grossly intact bilaterally.   Musculoskeletal Exam: Range of motion within normal limits to all pedal and ankle joints bilateral. Muscle strength 5/5 in all groups bilateral.   Radiographic Exam:  Shortened third metatarsal noted. Normal osseous mineralization. Joint spaces preserved. No fracture/dislocation/boney destruction.    Assessment: 1. 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 Patient evaluated. X-Rays reviewed again today.  2.  All possible complications and details the procedure were explained.  No guarantees were expressed or implied.  The patient would like to proceed with surgical intervention.  Patient understands that she will have a mini external rail to the dorsal aspect of her foot in order to create callus distraction and lengthening of the metatarsal.  All patient questions were answered.   3.  Authorization for surgery initiated  today.  Surgery will consist of third metatarsal osteotomy with application of external mini rail distractor left foot 4.  Return to clinic 1 week postop    Felecia Shelling, DPM [facility]  Dr. Felecia Shelling, DPM    [address] [place], [place] [address]                Office [phone]  Fax [phone]

## 2020-04-27 ENCOUNTER — Other Ambulatory Visit: Payer: Self-pay

## 2020-04-27 ENCOUNTER — Encounter: Payer: Self-pay | Admitting: Pediatrics

## 2020-04-27 ENCOUNTER — Ambulatory Visit (INDEPENDENT_AMBULATORY_CARE_PROVIDER_SITE_OTHER): Payer: Medicaid Other | Admitting: Pediatrics

## 2020-04-27 ENCOUNTER — Telehealth: Payer: Self-pay | Admitting: Pediatrics

## 2020-04-27 VITALS — BP 110/70 | Ht 62.0 in | Wt 125.8 lb

## 2020-04-27 DIAGNOSIS — Q669 Congenital deformity of feet, unspecified, unspecified foot: Secondary | ICD-10-CM

## 2020-04-27 DIAGNOSIS — M5432 Sciatica, left side: Secondary | ICD-10-CM

## 2020-04-27 MED ORDER — METHOCARBAMOL 500 MG PO TABS: 500.0000 mg | ORAL_TABLET | Freq: Two times a day (BID) | ORAL | 0 refills | Status: AC

## 2020-04-27 MED ORDER — IBUPROFEN 600 MG PO TABS: 600.0000 mg | ORAL_TABLET | Freq: Four times a day (QID) | ORAL | 1 refills | Status: DC | PRN

## 2020-04-27 NOTE — Progress Notes (Signed)
Subjective:    Julie Cherry is a 19 y.o. old female here for Leg Pain (muscle pain, all the time, started in March 2021, she went on a trip in March of this trip, pain went away and came back) .    No interpreter necessary.  HPI   19 year old presents with pain in the left leg-pain radiates back of thigh and down her left leg. No numbness or tingling in her foot. No back pain. Pain started yesterday. She has not taken any medication. No known injury to her back.   She has had sciatica diagnosed in the past on the right side 03/15/20-diagnosed in the ER. No xrays done at that time. She was treated with robaxin and ibuprofen-took meds for 2 weeks and the pain stopped. She did the stretches recommended but did not get PT.   Patient believes the pain started after a long car trip in April.   Patient has chronic pain in the left foot and plans surgery .  Prior concerns:  Patient seen at [facility] 04/14/20 and surgery planned to correct brachi metataralgia of the left metatarsal.  Seen in ED 03/15/20 for right hip and leg pain-treated with robaxin and advil.  Followed by adolescent clinic-nexplanon removed 03/15/20. Now on depo for birth control and has next appointment 07/11/20. Review of Systems  History and Problem List: Julie Cherry has Vaccination not carried out because of caregiver refusal; Allergic rhinitis; Heart murmur; Vitamin D deficiency; Breakthrough bleeding on Nexplanon; At risk for sexually transmitted disease due to unprotected sex; Congenital toe deformity- third left toe; and Left foot pain on their problem list.  Julie Cherry  has a past medical history of Asthma, Chlamydia (07/09/2019), Deliberate self-cutting (09/10/2013), and Pyelonephritis (05/27/2016).  Immunizations needed: needs HPV and menactra according to our records-too old to get vaccines here so recommended she go to [facility] Also recommended covid 19 vaccine     Objective:    BP 110/70 (BP Location: Right Arm, Patient  Position: Sitting, Cuff Size: Normal)   Ht 5\' 2"  (1.575 m)   Wt 125 lb 12.8 oz (57.1 kg)   BMI 23.01 kg/m  Physical Exam Vitals reviewed.  Constitutional:      Appearance: Normal appearance. She is not toxic-appearing.  Cardiovascular:     Rate and Rhythm: Normal rate and regular rhythm.  Pulmonary:     Effort: Pulmonary effort is normal.     Breath sounds: Normal breath sounds.  Musculoskeletal:     Comments: No palpable spinal or paraspinal tenderness. No pain on standing hip flexion. + pain back of right thigh with hip flexion in lying position. Normal distal pulses crt and sensation feet.   Neurological:     Mental Status: She is alert.        Assessment and Plan:   Julie Cherry is a 19 y.o. old female with recurrent sciatica.  1. Sciatica of left side Will treat with muscle relaxer and pain control for the next 5 days.  Hand out with stretching exercises reviewed with patient Referral made to sport's med since recurrent symptoms and to PT  - methocarbamol (ROBAXIN) 500 MG tablet; Take 1 tablet (500 mg total) by mouth 2 (two) times daily for 5 days.  Dispense: 10 tablet; Refill: 0 - ibuprofen (ADVIL) 600 MG tablet; Take 1 tablet (600 mg total) by mouth every 6 (six) hours as needed.  Dispense: 30 tablet; Refill: 1 - Ambulatory referral to Sports Medicine - Ambulatory referral to Physical Therapy  2. Congenital toe deformity- third left toe Has scheduled surgery 06/2020    Return if symptoms worsen or fail to improve.  Rae Lips, MD

## 2020-04-27 NOTE — Telephone Encounter (Signed)
Pre-screening for onsite visit  1. Who is bringing the patient to the visit? Patient  Informed only one adult can bring patient to the visit to limit possible exposure to COVID19 and facemasks must be worn while in the building by the patient (ages 2 and older) and adult.  2. Has the person bringing the patient or the patient been around anyone with suspected or confirmed COVID-19 in the last 14 days? {NO   3. Has the person bringing the patient or the patient been around anyone who has been tested for COVID-19 in the last 14 days? No  4. Has the person bringing the patient or the patient had any of these symptoms in the last 14 days? NO   Fever (temp 100 F or higher) Breathing problems Cough Sore throat Body aches Chills Vomiting Diarrhea Loss of taste or smell   If all answers are negative, advise patient to call our office prior to your appointment if you or the patient develop any of the symptoms listed above.   If any answers are yes, cancel in-office visit and schedule the patient for a same day telehealth visit with a provider to discuss the next steps.

## 2020-04-27 NOTE — Patient Instructions (Signed)
Sciatica  Sciatica is pain, weakness, tingling, or loss of feeling (numbness) along the sciatic nerve. The sciatic nerve starts in the lower back and goes down the back of each leg. Sciatica usually goes away on its own or with treatment. Sometimes, sciatica may come back (recur). What are the causes? This condition happens when the sciatic nerve is pinched or has pressure put on it. This may be the result of:  A disk in between the bones of the spine bulging out too far (herniated disk).  Changes in the spinal disks that occur with aging.  A condition that affects a muscle in the butt.  Extra bone growth near the sciatic nerve.  A break (fracture) of the area between your hip bones (pelvis).  Pregnancy.  Tumor. This is rare. What increases the risk? You are more likely to develop this condition if you:  Play sports that put pressure or stress on the spine.  Have poor strength and ease of movement (flexibility).  Have had a back injury in the past.  Have had back surgery.  Sit for long periods of time.  Do activities that involve bending or lifting over and over again.  Are very overweight (obese). What are the signs or symptoms? Symptoms can vary from mild to very bad. They may include:  Any of these problems in the lower back, leg, hip, or butt: ? Mild tingling, loss of feeling, or dull aches. ? Burning sensations. ? Sharp pains.  Loss of feeling in the back of the calf or the sole of the foot.  Leg weakness.  Very bad back pain that makes it hard to move. These symptoms may get worse when you cough, sneeze, or laugh. They may also get worse when you sit or stand for long periods of time. How is this treated? This condition often gets better without any treatment. However, treatment may include:  Changing or cutting back on physical activity when you have pain.  Doing exercises and stretching.  Putting ice or heat on the affected area.  Medicines that  help: ? To relieve pain and swelling. ? To relax your muscles.  Shots (injections) of medicines that help to relieve pain, irritation, and swelling.  Surgery. Follow these instructions at home: Medicines  Take over-the-counter and prescription medicines only as told by your doctor.  Ask your doctor if the medicine prescribed to you: ? Requires you to avoid driving or using heavy machinery. ? Can cause trouble pooping (constipation). You may need to take these steps to prevent or treat trouble pooping:  Drink enough fluids to keep your pee (urine) pale yellow.  Take over-the-counter or prescription medicines.  Eat foods that are high in fiber. These include beans, whole grains, and fresh fruits and vegetables.  Limit foods that are high in fat and sugar. These include fried or sweet foods. Managing pain      If told, put ice on the affected area. ? Put ice in a plastic bag. ? Place a towel between your skin and the bag. ? Leave the ice on for 20 minutes, 2-3 times a day.  If told, put heat on the affected area. Use the heat source that your doctor tells you to use, such as a moist heat pack or a heating pad. ? Place a towel between your skin and the heat source. ? Leave the heat on for 20-30 minutes. ? Remove the heat if your skin turns bright red. This is very important if you are   unable to feel pain, heat, or cold. You may have a greater risk of getting burned. Activity   Return to your normal activities as told by your doctor. Ask your doctor what activities are safe for you.  Avoid activities that make your symptoms worse.  Take short rests during the day. ? When you rest for a long time, do some physical activity or stretching between periods of rest. ? Avoid sitting for a long time without moving. Get up and move around at least one time each hour.  Exercise and stretch regularly, as told by your doctor.  Do not lift anything that is heavier than 10 lb (4.5 kg)  while you have symptoms of sciatica. ? Avoid lifting heavy things even when you do not have symptoms. ? Avoid lifting heavy things over and over.  When you lift objects, always lift in a way that is safe for your body. To do this, you should: ? Bend your knees. ? Keep the object close to your body. ? Avoid twisting. General instructions  Stay at a healthy weight.  Wear comfortable shoes that support your feet. Avoid wearing high heels.  Avoid sleeping on a mattress that is too soft or too hard. You might have less pain if you sleep on a mattress that is firm enough to support your back.  Keep all follow-up visits as told by your doctor. This is important. Contact a doctor if:  You have pain that: ? Wakes you up when you are sleeping. ? Gets worse when you lie down. ? Is worse than the pain you have had in the past. ? Lasts longer than 4 weeks.  You lose weight without trying. Get help right away if:  You cannot control when you pee (urinate) or poop (have a bowel movement).  You have weakness in any of these areas and it gets worse: ? Lower back. ? The area between your hip bones. ? Butt. ? Legs.  You have redness or swelling of your back.  You have a burning feeling when you pee. Summary  Sciatica is pain, weakness, tingling, or loss of feeling (numbness) along the sciatic nerve.  This condition happens when the sciatic nerve is pinched or has pressure put on it.  Sciatica can cause pain, tingling, or loss of feeling (numbness) in the lower back, legs, hips, and butt.  Treatment often includes rest, exercise, medicines, and putting ice or heat on the affected area. This information is not intended to replace advice given to you by your health care provider. Make sure you discuss any questions you have with your health care provider. Document Revised: 12/09/2018 Document Reviewed: 12/09/2018 Elsevier Patient Education  2020 [employer]  

## 2020-04-29 ENCOUNTER — Other Ambulatory Visit: Payer: Self-pay | Admitting: Sports Medicine

## 2020-04-29 ENCOUNTER — Other Ambulatory Visit: Payer: Self-pay

## 2020-04-29 ENCOUNTER — Ambulatory Visit (INDEPENDENT_AMBULATORY_CARE_PROVIDER_SITE_OTHER): Payer: Medicaid Other | Admitting: Sports Medicine

## 2020-04-29 ENCOUNTER — Ambulatory Visit
Admission: RE | Admit: 2020-04-29 | Discharge: 2020-04-29 | Disposition: A | Discharge status: Home / Self Care | Payer: Medicaid Other | Source: Ambulatory Visit | Attending: Sports Medicine | Admitting: Sports Medicine

## 2020-04-29 VITALS — BP 102/64 | Ht 62.0 in | Wt 125.0 lb

## 2020-04-29 DIAGNOSIS — M5432 Sciatica, left side: Secondary | ICD-10-CM | POA: Diagnosis not present

## 2020-04-29 DIAGNOSIS — M545 Low back pain: Secondary | ICD-10-CM | POA: Diagnosis not present

## 2020-04-29 MED ORDER — MELOXICAM 15 MG PO TABS: ORAL_TABLET | ORAL | 0 refills | Status: DC

## 2020-04-29 NOTE — Patient Instructions (Signed)
I think the pain in your legs is coming from your low back. I am going to get an x-ray today and I will call you tomorrow with those findings. If the x-ray is normal I am going to have you go to physical therapy. I want to see you again in 4 weeks to see how you are doing. I will call in a prescription for meloxicam 15 mg.  Take 1 pill a day for 5 days.  Take it with food.  If it helps, you can take it as needed after the 5 days.

## 2020-04-30 ENCOUNTER — Encounter: Payer: Self-pay | Admitting: Sports Medicine

## 2020-04-30 NOTE — Progress Notes (Signed)
   Subjective:    Patient ID: [name], female    DOB: 2000-12-27, 19 y.o.   MRN: [id number]  HPI chief complaint: Bilateral leg pain  Very pleasant 19 year old female comes in today complaining of bilateral lower leg pain.  Pain started initially in the right leg back in April without any inciting event.  She describes a tingling and cramping type of sensation that will begin along the lateral hip and radiate down the leg into the calf and into the lateral aspect of the foot.  She was seen in the emergency room at that time and discharged home.  Recently she began to experience similar symptoms in the left leg.  Her symptoms are intermittent and tend to be worse with standing.  Sitting is more comfortable.  She has not noticed any weakness.  She denies groin pain.  She has not had any imaging to date.  She has been prescribed Advil as well as Robaxin but neither of those seem to help.  She denies any prior problem with her back in the past.  No prior lumbar spine surgeries.  Past medical history reviewed Medications reviewed Allergies reviewed   Review of Systems As above    Objective:   Physical Exam  Well-developed, well-nourished.  No acute distress.  Awake alert and oriented x3.  Vital signs reviewed  Lumbar spine: There is no tenderness to palpation along the lumbar midline.  No appreciable paraspinal musculature spasm.  Patient has full painless lumbar range of motion.  No tenderness at the SI joint.  Hip exam: Patient has full painless hip range of motion bilaterally.  Negative logroll bilaterally.  Neurological exam: Negative straight leg raise bilaterally.  Reflexes are brisk and equal at the Achilles and patellar reflexes.  Strength is 5/5 in both lower extremities.  No atrophy.  Sensation is intact to light touch grossly.  X-rays of the lumbar spine including AP, lateral, flexion and extension views show no obvious abnormality.  Specifically no evidence of  spondylolisthesis or disc space narrowing.      Assessment & Plan:   Bilateral lower leg pain likely secondary to mild lumbar radiculopathy  Given her unremarkable x-rays I am going to refer this patient to physical therapy.  She will follow up with me again in 4 weeks for reevaluation.  If symptoms persist or worsen then consider merits of further diagnostic imaging.  In the meantime, I will put her on meloxicam 15 mg daily for 5 days.  She may then take it as needed afterwards.  Call with questions or concerns prior to her follow-up visit.

## 2020-05-03 ENCOUNTER — Ambulatory Visit: Payer: Medicaid Other

## 2020-05-11 ENCOUNTER — Ambulatory Visit (INDEPENDENT_AMBULATORY_CARE_PROVIDER_SITE_OTHER): Payer: Medicaid Other

## 2020-05-11 ENCOUNTER — Other Ambulatory Visit: Payer: Self-pay

## 2020-05-11 DIAGNOSIS — Z3042 Encounter for surveillance of injectable contraceptive: Secondary | ICD-10-CM

## 2020-05-11 DIAGNOSIS — Z3049 Encounter for surveillance of other contraceptives: Secondary | ICD-10-CM | POA: Diagnosis not present

## 2020-05-11 MED ORDER — MEDROXYPROGESTERONE ACETATE 150 MG/ML IM SUSP
150.0000 mg | Freq: Once | INTRAMUSCULAR | Status: AC
  Administered 2020-05-11: 150 mg via INTRAMUSCULAR

## 2020-05-11 NOTE — Progress Notes (Signed)
Pt presents for depo injection. Pt within depo window, no urine hcg needed. Injection given, tolerated well. F/u depo injection visit scheduled.   

## 2020-05-12 ENCOUNTER — Ambulatory Visit: Payer: Medicaid Other | Attending: Pediatrics

## 2020-05-12 DIAGNOSIS — M5442 Lumbago with sciatica, left side: Secondary | ICD-10-CM | POA: Insufficient documentation

## 2020-05-12 DIAGNOSIS — M62830 Muscle spasm of back: Secondary | ICD-10-CM | POA: Insufficient documentation

## 2020-05-12 DIAGNOSIS — M5441 Lumbago with sciatica, right side: Secondary | ICD-10-CM | POA: Insufficient documentation

## 2020-05-13 ENCOUNTER — Other Ambulatory Visit: Payer: Self-pay

## 2020-05-13 ENCOUNTER — Ambulatory Visit: Payer: Medicaid Other | Admitting: Physical Therapy

## 2020-05-13 ENCOUNTER — Encounter: Payer: Self-pay | Admitting: Physical Therapy

## 2020-05-13 DIAGNOSIS — M5442 Lumbago with sciatica, left side: Secondary | ICD-10-CM | POA: Diagnosis present

## 2020-05-13 DIAGNOSIS — M5441 Lumbago with sciatica, right side: Secondary | ICD-10-CM

## 2020-05-13 DIAGNOSIS — M62830 Muscle spasm of back: Secondary | ICD-10-CM | POA: Diagnosis present

## 2020-05-13 NOTE — Patient Instructions (Signed)
Access Code: [email or web address]  Supine Single Knee to Chest Stretch - 1 x daily - 7 x weekly - 3 sets - 3 reps - hold  Supine Piriformis Stretch with Foot on Ground - 1 x daily - 7 x weekly - 3 sets - 3 reps - 20 sec hold  Standing Glute Med Mobilization with Small Ball on Wall - 1 x daily - 7 x weekly - 10 reps - 3 sets  Supine Bridge - 1 x daily - 7 x weekly - 10 reps -

## 2020-05-14 ENCOUNTER — Encounter: Payer: Self-pay | Admitting: Physical Therapy

## 2020-05-14 NOTE — Therapy (Signed)
[place], [place], [address] Phone: [phone]   Fax:  [phone]  Physical Therapy Evaluation  Patient Details  Name: Julie Cherry MRN: [id number] Date of Birth: 05/28/2001 Referring Provider (PT): Dr Rae Lips    Encounter Date: 05/13/2020   PT End of Session - 05/13/20 1642    Visit Number 1    Number of Visits 6    Authorization Type mediciad    PT Start Time 1630    PT Stop Time 1713    PT Time Calculation (min) 43 min    Activity Tolerance Patient tolerated treatment well    Behavior During Therapy [facility] for tasks assessed/performed           Past Medical History:  Diagnosis Date  . Asthma    Last had symptoms age 19.   . Chlamydia 07/09/2019  . 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 Pyelonephritis 05/27/2016    Past Surgical History:  Procedure Laterality Date  . MOUTH SURGERY    . TOOTH EXTRACTION  Aug 2014   severe decay    There were no vitals filed for this visit.    Subjective Assessment - 05/13/20 1636    Subjective Patient began having bilateral lower back pain that goes down the back of both legs to her heels. The pain never happens at the same time. She has the most pain when she stands or sits for a long period of time.    Limitations Standing;Lifting    How long can you sit comfortably? when she sits too long her back stiffens up    How long can you stand comfortably? limited time before it starts to hurt    How long can you walk comfortably? Does well walking    Diagnostic tests X-ray: (-)    Patient Stated Goals to have less pain in her back    Currently in Pain? Yes    Pain Score 3     Pain Location Back    Pain Orientation Right;Left    Pain Descriptors / Indicators Aching    Pain Type Acute pain    Pain Radiating Towards pain radiates into bilatera LE but never at the same time    Pain Onset More than a month ago    Pain Frequency Intermittent      Aggravating Factors  sitting, standing    Pain Relieving Factors mobic, lying down    Effect of Pain on Daily Activities difficulty standing for too long              [facility] PT Assessment - 05/14/20 0001      Assessment   Medical Diagnosis Low Back Pain with bilateral Radiculopathy     Referring Provider (PT) Dr Rae Lips     Onset Date/Surgical Date --   April 2021    Hand Dominance Right    Next MD Visit 6/24 with Dr Micheline Chapman     Prior Therapy none       Precautions   Precautions None      Restrictions   Weight Bearing Restrictions No      Balance Screen   Has the patient fallen in the past 6 months No    Has the patient had a decrease in activity level because of a fear of falling?  No    Is the patient reluctant to leave their home because of a fear of falling?  No      Home Environment  Additional Comments Steps into the house.  Has no difficulty       Prior Function   Level of Independence Independent    Energy manager Requirements nursing student     Leisure Nothing      Cognition   Overall Cognitive Status Within Functional Limits for tasks assessed    Attention Focused    Focused Attention Appears intact    Memory Appears intact    Awareness Appears intact    Problem Solving Appears intact      Observation/Other Assessments   Focus on Therapeutic Outcomes (FOTO)  Medicaid       Sensation   Light Touch Appears Intact    Additional Comments denies parathesias. radicular pain down to bilateral ankles       Coordination   Gross Motor Movements are Fluid and Coordinated Yes    Fine Motor Movements are Fluid and Coordinated Yes      AROM   Lumbar Flexion 50    Lumbar Extension WNL     Lumbar - Right Side Bend WNL    Lumbar - Left Side Bend WNL    Lumbar - Right Rotation WNL    Lumbar - Left Rotation WNL      PROM   Overall PROM Comments significant limitations in hip flexion for her age and fitness level. Hip flexion on left  to 95 degrees with restance but no pain. On the right 90 with resitance but no report of pain.       Strength   Right Hip Flexion 4+/5    Right Hip ABduction 5/5    Left Hip Flexion 4+/5    Left Hip Extension 4+/5    Left Hip ABduction 5/5      Palpation   Palpation comment spasming in blaiteral gluteals and bilateral QL      Ambulation/Gait   Gait Comments decreased arm swing bilateral                       Objective measurements completed on examination: See above findings.       [facility] Adult PT Treatment/Exercise - 05/14/20 0001      Lumbar Exercises: Stretches   Single Knee to Chest Stretch Limitations 2x30 sec hold     Figure 4 Stretch Limitations piriformis stretch 2x30 sec bilateral    Other Lumbar Stretch Exercise tennis ball release to gluteals                   PT Education - 05/14/20 0856    Education Details reviewed self soft tissue mobilization and the improtance of hip mobility.    Person(s) Educated Patient    Methods Explanation;Demonstration;Tactile cues;Verbal cues    Comprehension Verbalized understanding;Returned demonstration;Verbal cues required;Tactile cues required            PT Short Term Goals - 05/14/20 0903      PT SHORT TERM GOAL #1   Title Patient will increase bilateral hip flexion to 115 degres    Baseline 95 left 90 right    Time 3    Period Weeks    Status New    Target Date 06/04/20      PT SHORT TERM GOAL #2   Title Patient will increase lumbar flexion to 20 degrees    Baseline 50 no pain but restricted in movement    Time 3    Period Weeks    Status New    Target  Date 06/04/20      PT SHORT TERM GOAL #3   Title Patient will report no radicular pain    Baseline radiating pain to bilateral heels at times    Time 3    Period Weeks    Status New    Target Date 06/04/20             PT Long Term Goals - 05/14/20 0906      PT LONG TERM GOAL #1   Title Patient will be independent with high  level core stability exercises and a stretching program    Baseline does not stretch or exercise    Time 6    Period Weeks    Status New    Target Date 06/25/20      PT LONG TERM GOAL #2   Title Patient will stand for 1 hour without increased pain in order to perfreom school related    Baseline pain after 15-20 minutes    Time 6    Period Weeks    Status New    Target Date 06/25/20                  Plan - 05/13/20 1714    Clinical Impression Statement Patient is a 19 year old female with bilateral low back pain with bilateral radiculopathy. She has increased pain when she sits or stands for too long. She has significant limitations in hip flexion for someone her age and mobility level. She also has spasming of bilateral upper gluteals. The trigger point in her glute medius area can corelate to radicular pain into the heel. She was given hip stretching and slef trigger point release for home. She would benefit from further skilled therapy for manual therapy to lumbar spine and hips as well as a progressive core strength and stability program.    Personal Factors and Comorbidities Fitness   reports she dosent do much   Examination-Activity Limitations Sit;Transfers;Stand    Examination-Participation Restrictions Shop;Community Activity    Stability/Clinical Decision Making Stable/Uncomplicated    Clinical Decision Making Moderate    Rehab Potential Good    PT Frequency 2x / week    PT Duration 4 weeks    PT Treatment/Interventions ADLs/Self Care Home Management;Electrical Stimulation;Cryotherapy;Iontophoresis 4mg /ml Dexamethasone;Moist Heat;Functional mobility training;Therapeutic activities;Therapeutic exercise;Neuromuscular re-education;Patient/family education;Manual techniques;Passive range of motion;Dry needling;Taping    PT Next Visit Plan soft tissue mobilization to bilateral gluteals; LAD bilateral, continue with stabilization exercises, posterio hip mobds for flexion,  consder needling ? progress to high level stabilization ASAP    PT Home Exercise Plan Access Code: [email or web address] Single Knee to Chest Stretch - 1 x daily - 7 x weekly - 3 sets - 3 reps - hold.Supine Piriformis Stretch with Foot on Ground - 1 x daily - 7 x weekly - 3 sets - 3 reps - 20 sec hold.Standing Glute Med Mobilization with Small Ball on Wall - 1 x daily - 7 x weekly - 10 reps - 3 sets.Supine Bridge - 1 x daily - 7 x weekly - 10 reps -    Consulted and Agree with Plan of Care Patient           Patient will benefit from skilled therapeutic intervention in order to improve the following deficits and impairments:  Abnormal gait, Difficulty walking, Decreased range of motion, Increased fascial restricitons, Increased muscle spasms, Decreased mobility, Pain, Decreased activity tolerance  Visit Diagnosis: Acute bilateral low back pain with bilateral sciatica  Muscle spasm of  back     Problem List Patient Active Problem List   Diagnosis Date Noted  . 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 Left foot pain 02/16/2020  . At risk for sexually transmitted disease due to unprotected sex 08/06/2019  . Breakthrough bleeding on Nexplanon 07/19/2017  . Vitamin D deficiency 01/20/2016  . Heart murmur 12/02/2013  . Vaccination not carried out because of caregiver refusal 09/10/2013  . Allergic rhinitis 09/10/2013    Dessie Coma PT DPT  05/14/2020, 9:20 AM  [facility] [address] [place], [place], [address] Phone: [phone]   Fax:  [phone]  Name: Mirayah Wren MRN: [id number] Date of Birth: [birth year]

## 2020-05-14 NOTE — Addendum Note (Signed)
Addended by: Dessie Coma on: 05/14/2020 09:22 AM   Modules accepted: Orders

## 2020-05-18 ENCOUNTER — Encounter: Payer: Medicaid Other | Admitting: Physical Therapy

## 2020-05-27 ENCOUNTER — Ambulatory Visit (INDEPENDENT_AMBULATORY_CARE_PROVIDER_SITE_OTHER): Payer: Medicaid Other | Admitting: Sports Medicine

## 2020-05-27 ENCOUNTER — Other Ambulatory Visit: Payer: Self-pay

## 2020-05-27 VITALS — BP 112/62 | Ht 62.0 in | Wt 125.0 lb

## 2020-05-27 DIAGNOSIS — M5432 Sciatica, left side: Secondary | ICD-10-CM | POA: Diagnosis not present

## 2020-05-27 NOTE — Progress Notes (Signed)
   Subjective:    Patient ID: [id number] Julie Cherry, female    DOB: 02/10/2001, 18 y.o.   MRN: [id number]  HPI   Patient comes in today for follow-up on lumbar radiculopathy.  She is improving.  Pain in her left leg has resolved.  She still has some discomfort in the left side of her low back and left hip.  It is most noticeable with sitting.  She has only had one physical therapy visit but found it helpful.  She has another visit scheduled for tomorrow.  She is not taking any medication for pain.    Review of Systems As above    Objective:   Physical Exam  Well-developed, well-nourished.  No acute distress.  Awake alert and oriented x3.  Vital signs reviewed  Lumbar spine: Full range of motion.  No pain.  No tenderness to palpation.  Neurological exam: Reflexes remain brisk and equal at the Achilles and patellar tendons bilaterally.  Strength is still 5/5 bilaterally.  No atrophy.  Sensation is intact light touch grossly.      Assessment & Plan:   Improving lumbar radiculopathy  I encouraged the patient to continue with physical therapy until they feel like she is ready for discharge to a home exercise program.  Given her overall improvement, I do not think we need further diagnostic imaging at this time.  Previous lumbar x-rays were unremarkable.  However, if she begins to experience returning pain, numbness, or tingling down either leg that is unresponsive to physical therapy then I would reconsider an MRI to rule out lumbar disc herniation.  Follow-up for ongoing or recalcitrant issues.

## 2020-05-28 ENCOUNTER — Ambulatory Visit: Payer: Medicaid Other | Admitting: Physical Therapy

## 2020-05-28 DIAGNOSIS — M5442 Lumbago with sciatica, left side: Secondary | ICD-10-CM

## 2020-05-28 DIAGNOSIS — M5441 Lumbago with sciatica, right side: Secondary | ICD-10-CM

## 2020-05-28 DIAGNOSIS — M62830 Muscle spasm of back: Secondary | ICD-10-CM

## 2020-05-28 NOTE — Therapy (Signed)
[facility] [address] [place], [place], [address] Phone: [phone]   Fax:  [phone]  Physical Therapy Treatment  Patient Details  Name: Julie Cherry MRN: [id number] Date of Birth: 05/25/2001 Referring Provider (PT): Dr Kalman Jewels    Encounter Date: 05/28/2020   PT End of Session - 05/28/20 1043    Visit Number 2    Number of Visits 6    Date for PT Re-Evaluation 06/25/20    Authorization Type mediciad    Authorization - Visit Number --    Authorization - Number of Visits --    PT Start Time 0955    PT Stop Time 1040    PT Time Calculation (min) 45 min    Activity Tolerance Patient tolerated treatment well    Behavior During Therapy [facility] for tasks assessed/performed           Past Medical History:  Diagnosis Date   Asthma    Last had symptoms age 45.    Chlamydia 07/09/2019   Deliberate self-cutting 09/10/2013   Pyelonephritis 05/27/2016    Past Surgical History:  Procedure Laterality Date   MOUTH SURGERY     TOOTH EXTRACTION  Aug 2014   severe decay    There were no vitals filed for this visit.   Subjective Assessment - 05/28/20 0956    Subjective Pt reports pain has gotten a lot better. She states she feels it mostly in her Lt thigh now; Rt leg no longer bothers her. Pt reports she has been doing the exercises daily for the most part.    Limitations Standing;Lifting    How long can you sit comfortably? when she sits too long her back stiffens up    How long can you stand comfortably? limited time before it starts to hurt    How long can you walk comfortably? Does well walking    Diagnostic tests X-ray: (-)    Patient Stated Goals to have less pain in her back    Currently in Pain? Yes    Pain Score 8     Pain Location Back    Pain Orientation Left    Pain Descriptors / Indicators Pressure    Pain Type Acute pain    Pain Radiating Towards Lt thigh    Pain Onset More than a month  ago    Pain Frequency Intermittent                             [facility] Adult PT Treatment/Exercise - 05/28/20 0001      Lumbar Exercises: Stretches   Single Knee to Chest Stretch Limitations 2x30 sec hold     Figure 4 Stretch Limitations piriformis stretch 2x30 sec bilateral      Lumbar Exercises: Aerobic   Nustep L6 x 5 min      Lumbar Exercises: Standing   Other Standing Lumbar Exercises Squat x 10 reps   green tband around knees     Lumbar Exercises: Seated   Other Seated Lumbar Exercises Hip flexion on swiss ball x 10 reps, alternating hip and shoulder flexion on swiss ball x 10 reps      Lumbar Exercises: Supine   Clam 10 reps;5 seconds    Bridge 10 reps    Bridge with March 10 reps    Other Supine Lumbar Exercises Ball squeeze 10 reps x 5 sec      Lumbar Exercises: Sidelying  Hip Abduction Both;10 reps      Lumbar Exercises: Prone   Opposite Arm/Leg Raise Right arm/Left leg;Left arm/Right leg;10 reps      Manual Therapy   Manual Therapy Joint mobilization;Soft tissue mobilization    Joint Mobilization Posterior hip mobs grade II to III; sacral compression on L PSIS    Soft tissue mobilization STW glues, piriformis, QL                    PT Short Term Goals - 05/14/20 0903      PT SHORT TERM GOAL #1   Title Patient will increase bilateral hip flexion to 115 degres    Baseline 95 left 90 right    Time 3    Period Weeks    Status New    Target Date 06/04/20      PT SHORT TERM GOAL #2   Title Patient will increase lumbar flexion to 20 degrees    Baseline 50 no pain but restricted in movement    Time 3    Period Weeks    Status New    Target Date 06/04/20      PT SHORT TERM GOAL #3   Title Patient will report no radicular pain    Baseline radiating pain to bilateral heels at times    Time 3    Period Weeks    Status New    Target Date 06/04/20             PT Long Term Goals - 05/14/20 0906      PT LONG TERM GOAL #1    Title Patient will be independent with high level core stability exercises and a stretching program    Baseline does not stretch or exercise    Time 6    Period Weeks    Status New    Target Date 06/25/20      PT LONG TERM GOAL #2   Title Patient will stand for 1 hour without increased pain in order to perfreom school related    Baseline pain after 15-20 minutes    Time 6    Period Weeks    Status New    Target Date 06/25/20                 Plan - 05/28/20 1043    Clinical Impression Statement Pt already with improving symptoms. Treatment focused on STW on tightened L low back/hip/SI musculature as well as SI compression on L and posterior hip mobs. PT progressed pt's strengthening/stabilization exercises.    Personal Factors and Comorbidities Fitness   reports she dosent do much   Examination-Activity Limitations Sit;Transfers;Stand    Examination-Participation Restrictions Shop;Community Activity    Stability/Clinical Decision Making Stable/Uncomplicated    Rehab Potential Good    PT Frequency 2x / week    PT Duration 4 weeks    PT Treatment/Interventions ADLs/Self Care Home Management;Electrical Stimulation;Cryotherapy;Iontophoresis 4mg /ml Dexamethasone;Moist Heat;Functional mobility training;Therapeutic activities;Therapeutic exercise;Neuromuscular re-education;Patient/family education;Manual techniques;Passive range of motion;Dry needling;Taping    PT Next Visit Plan STM glutes,piriformis, PSIS; Continue with stabilization exercises, posterio hip mobds for flexion, progress to high level stabilization    PT Home Exercise Plan Access Code:    Consulted and Agree with Plan of Care Patient           Patient will benefit from skilled therapeutic intervention in order to improve the following deficits and impairments:  Abnormal gait, Difficulty walking, Decreased range of motion, Increased fascial restricitons, Increased  muscle spasms, Decreased mobility, Pain,  Decreased activity tolerance  Visit Diagnosis: Acute bilateral low back pain with bilateral sciatica  Muscle spasm of back     Problem List Patient Active Problem List   Diagnosis Date Noted   Congenital toe deformity- third left toe 02/16/2020   Left foot pain 02/16/2020   At risk for sexually transmitted disease due to unprotected sex 08/06/2019   Breakthrough bleeding on Nexplanon 07/19/2017   Vitamin D deficiency 01/20/2016   Heart murmur 12/02/2013   Vaccination not carried out because of caregiver refusal 09/10/2013   Allergic rhinitis 09/10/2013    [facility] April Gordy Levan PT, DPT 05/28/2020, 10:48 AM  [facility] [address] [place], [place], [address] Phone: [phone]   Fax:  [phone]  Name: Julie Cherry MRN: [id number] Date of Birth: 04/22/2001

## 2020-06-01 ENCOUNTER — Other Ambulatory Visit: Payer: Self-pay

## 2020-06-01 ENCOUNTER — Ambulatory Visit: Payer: Medicaid Other | Admitting: Physical Therapy

## 2020-06-01 ENCOUNTER — Encounter: Payer: Self-pay | Admitting: Physical Therapy

## 2020-06-01 DIAGNOSIS — M5442 Lumbago with sciatica, left side: Secondary | ICD-10-CM

## 2020-06-01 DIAGNOSIS — M5441 Lumbago with sciatica, right side: Secondary | ICD-10-CM

## 2020-06-01 DIAGNOSIS — M62830 Muscle spasm of back: Secondary | ICD-10-CM

## 2020-06-02 NOTE — Therapy (Addendum)
[place], [place], [address] Phone: [phone]   Fax:  [phone]  Physical Therapy Treatment/Discharge   Patient Details  Name: Julie Cherry MRN: [id number] Date of Birth: 2001-04-16 Referring Provider (PT): Dr Rae Lips    Encounter Date: 06/01/2020   PT End of Session - 06/01/20 1719    Visit Number 3    Number of Visits 6    Date for PT Re-Evaluation 06/25/20    Authorization Type mediciad    PT Start Time 1547    PT Stop Time 1630    PT Time Calculation (min) 43 min    Activity Tolerance Patient tolerated treatment well    Behavior During Therapy [facility] for tasks assessed/performed           Past Medical History:  Diagnosis Date  . Asthma    Last had symptoms age 17.   . Chlamydia 07/09/2019  . 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 Pyelonephritis 05/27/2016    Past Surgical History:  Procedure Laterality Date  . MOUTH SURGERY    . TOOTH EXTRACTION  Aug 2014   severe decay    There were no vitals filed for this visit.   Subjective Assessment - 06/01/20 1550    Subjective Patient reports pain has centralizied into her back. she has not had pain down into her thigh or foot. sitting and walking longer distances still brings up pain. pt reports she has been doing her home exercises but is having some pain with the bridging ones. she reports she was sore after last session.    Pain Score 4     Pain Location Back    Pain Orientation Left                             [facility] Adult PT Treatment/Exercise - 06/02/20 0001      Lumbar Exercises: Stretches   Figure 4 Stretch Limitations piriformis stretch 2x30 sec bilateral      Lumbar Exercises: Aerobic   Recumbent Bike 5 min      Lumbar Exercises: Supine   Clam 10 reps;5 seconds   red   Bridge 10 reps      Manual Therapy   Manual Therapy Joint mobilization;Soft tissue mobilization    Joint Mobilization  Posterior hip mobs grade II to III    Soft tissue mobilization STW glues, piriformis            Trigger Point Dry Needling - 06/02/20 0001    Consent Given? Yes    Education Handout Provided Yes    Muscles Treated Back/Hip Gluteus medius;Piriformis    Other Dry Needling 75x.30    Gluteus Medius Response Twitch response elicited;Palpable increased muscle length    Piriformis Response Twitch response elicited                PT Education - 06/01/20 1718    Education Details educated on dry needling and HEP    Person(s) Educated Patient    Methods Explanation;Demonstration;Tactile cues;Verbal cues    Comprehension Verbal cues required;Tactile cues required;Returned demonstration;Verbalized understanding            PT Short Term Goals - 05/14/20 0903      PT SHORT TERM GOAL #1   Title Patient will increase bilateral hip flexion to 115 degres    Baseline 95 left 90 right    Time 3    Period Weeks  Status New    Target Date 06/04/20      PT SHORT TERM GOAL #2   Title Patient will increase lumbar flexion to 20 degrees    Baseline 50 no pain but restricted in movement    Time 3    Period Weeks    Status New    Target Date 06/04/20      PT SHORT TERM GOAL #3   Title Patient will report no radicular pain    Baseline radiating pain to bilateral heels at times    Time 3    Period Weeks    Status New    Target Date 06/04/20             PT Long Term Goals - 05/14/20 0906      PT LONG TERM GOAL #1   Title Patient will be independent with high level core stability exercises and a stretching program    Baseline does not stretch or exercise    Time 6    Period Weeks    Status New    Target Date 06/25/20      PT LONG TERM GOAL #2   Title Patient will stand for 1 hour without increased pain in order to perfreom school related    Baseline pain after 15-20 minutes    Time 6    Period Weeks    Status New    Target Date 06/25/20                 Plan  - 06/01/20 1720    Clinical Impression Statement Patient's symptoms were improving. Therapy did dry needling for the first time to medial glutes and piriformis. Patient educated on continuing to move over the next 24-48 hours. She tolerated dry needling well. therapy worked on stretching piriformis and strengthening hips and glutes. Her hip flexion has improved to about 105 degrees bilateralwithout pain,    Personal Factors and Comorbidities Fitness    Examination-Activity Limitations Sit;Transfers;Stand    Examination-Participation Restrictions Shop;Community Activity    Stability/Clinical Decision Making Stable/Uncomplicated    Clinical Decision Making Moderate    Rehab Potential Good    PT Frequency 2x / week    PT Duration 4 weeks    PT Treatment/Interventions ADLs/Self Care Home Management;Electrical Stimulation;Cryotherapy;Iontophoresis 45m/ml Dexamethasone;Moist Heat;Functional mobility training;Therapeutic activities;Therapeutic exercise;Neuromuscular re-education;Patient/family education;Manual techniques;Passive range of motion;Dry needling;Taping    PT Next Visit Plan STM glutes,piriformis, PSIS; Continue with stabilization exercises, posterio hip mobds for flexion, progress to high level stabilization    PT Home Exercise Plan Access Code: [email or web address]          Patient will benefit from skilled therapeutic intervention in order to improve the following deficits and impairments:  Abnormal gait, Difficulty walking, Decreased range of motion, Increased fascial restricitons, Increased muscle spasms, Decreased mobility, Pain, Decreased activity tolerance  Visit Diagnosis: Acute bilateral low back pain with bilateral sciatica  Muscle spasm of back     Problem List Patient Active Problem List   Diagnosis Date Noted  . 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 Left foot pain 02/16/2020  . At risk for sexually transmitted disease due to unprotected sex 08/06/2019  .  Breakthrough bleeding on Nexplanon 07/19/2017  . Vitamin D deficiency 01/20/2016  . Heart murmur 12/02/2013  . Vaccination not carried out because of caregiver refusal 09/10/2013  . Allergic rhinitis 09/10/2013    PHYSICAL THERAPY DISCHARGE SUMMARY  Visits from Start of Care: 3  Current functional level related  to goals / functional outcomes:did not return    Remaining deficits: Did not return    Education / Equipment: Did not return   Plan: Patient agrees to discharge.  Patient goals were not met. Patient is being discharged due to not returning since the last visit.  ?????      Carney Living PT DPT  06/02/2020, 11:32 AM  [facility] [address] [place], [place], [address] Phone: [phone]   Fax:  [phone]  Name: Julie Cherry MRN: [id number] Date of Birth: 04-Aug-2001

## 2020-06-03 ENCOUNTER — Encounter: Payer: Self-pay | Admitting: Podiatry

## 2020-06-23 ENCOUNTER — Encounter: Payer: Medicaid Other | Admitting: Podiatry

## 2020-07-05 ENCOUNTER — Encounter: Payer: Medicaid Other | Admitting: Podiatry

## 2020-07-10 ENCOUNTER — Encounter (HOSPITAL_COMMUNITY): Payer: Self-pay | Admitting: Emergency Medicine

## 2020-07-10 ENCOUNTER — Ambulatory Visit (HOSPITAL_COMMUNITY)
Admission: EM | Admit: 2020-07-10 | Discharge: 2020-07-10 | Disposition: A | Discharge status: Home / Self Care | Payer: Medicaid Other | Attending: Family Medicine | Admitting: Family Medicine

## 2020-07-10 ENCOUNTER — Telehealth: Payer: Self-pay | Admitting: Family Medicine

## 2020-07-10 ENCOUNTER — Other Ambulatory Visit: Payer: Self-pay

## 2020-07-10 DIAGNOSIS — Z20822 Contact with and (suspected) exposure to covid-19: Secondary | ICD-10-CM | POA: Insufficient documentation

## 2020-07-10 DIAGNOSIS — R110 Nausea: Secondary | ICD-10-CM | POA: Diagnosis not present

## 2020-07-10 DIAGNOSIS — R1030 Lower abdominal pain, unspecified: Secondary | ICD-10-CM | POA: Diagnosis not present

## 2020-07-10 DIAGNOSIS — R102 Pelvic and perineal pain: Secondary | ICD-10-CM | POA: Diagnosis not present

## 2020-07-10 DIAGNOSIS — R300 Dysuria: Secondary | ICD-10-CM | POA: Diagnosis not present

## 2020-07-10 DIAGNOSIS — R011 Cardiac murmur, unspecified: Secondary | ICD-10-CM | POA: Diagnosis not present

## 2020-07-10 LAB — POCT URINALYSIS DIPSTICK, ED / UC
Bilirubin Urine: NEGATIVE
Glucose, UA: NEGATIVE mg/dL
Leukocytes,Ua: NEGATIVE
Nitrite: NEGATIVE
Protein, ur: NEGATIVE mg/dL
Specific Gravity, Urine: 1.025 (ref 1.005–1.030)
Urobilinogen, UA: 0.2 mg/dL (ref 0.0–1.0)
pH: 6 (ref 5.0–8.0)

## 2020-07-10 LAB — CBC
HCT: 40.9 % (ref 36.0–46.0)
Hemoglobin: 13.2 g/dL (ref 12.0–15.0)
MCH: 26 pg (ref 26.0–34.0)
MCHC: 32.3 g/dL (ref 30.0–36.0)
MCV: 80.7 fL (ref 80.0–100.0)
Platelets: 309 10*3/uL (ref 150–400)
RBC: 5.07 MIL/uL (ref 3.87–5.11)
RDW: 12.8 % (ref 11.5–15.5)
WBC: 5.6 10*3/uL (ref 4.0–10.5)
nRBC: 0 % (ref 0.0–0.2)

## 2020-07-10 LAB — COMPREHENSIVE METABOLIC PANEL
ALT: 11 U/L (ref 0–44)
AST: 17 U/L (ref 15–41)
Albumin: 4.3 g/dL (ref 3.5–5.0)
Alkaline Phosphatase: 34 U/L — ABNORMAL LOW (ref 38–126)
Anion gap: 8 (ref 5–15)
BUN: 11 mg/dL (ref 6–20)
CO2: 24 mmol/L (ref 22–32)
Calcium: 9.7 mg/dL (ref 8.9–10.3)
Chloride: 106 mmol/L (ref 98–111)
Creatinine, Ser: 0.78 mg/dL (ref 0.44–1.00)
GFR calc Af Amer: >60 mL/min (ref >60)
GFR calc non Af Amer: >60 mL/min (ref >60)
Glucose, Bld: 82 mg/dL (ref 70–99)
Potassium: 3.7 mmol/L (ref 3.5–5.1)
Sodium: 138 mmol/L (ref 135–145)
Total Bilirubin: 1.2 mg/dL (ref 0.3–1.2)
Total Protein: 7.6 g/dL (ref 6.5–8.1)

## 2020-07-10 LAB — POC URINE PREG, ED: Preg Test, Ur: NEGATIVE

## 2020-07-10 LAB — SARS CORONAVIRUS 2 (TAT 6-24 HRS): SARS Coronavirus 2: NEGATIVE

## 2020-07-10 NOTE — ED Provider Notes (Signed)
[facility]    CSN: [id number] Arrival date & time: 07/10/20  1437      History   Chief Complaint Chief Complaint  Patient presents with  . Abdominal Pain    HPI Julie Cherry is a 19 y.o. female.   She is presenting with suprapubic pain as well as left flank pain.  The symptoms been present for a few days.  Denies a history of fever or chills.  No new or different exercise or inciting event.  No vaginal discharge.  Has some dysuria.  No history of surgery.  HPI  Past Medical History:  Diagnosis Date  . Asthma    Last had symptoms age 71.   . Chlamydia 07/09/2019  . 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 Pyelonephritis 05/27/2016    Patient Active Problem List   Diagnosis Date Noted  . 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 Left foot pain 02/16/2020  . At risk for sexually transmitted disease due to unprotected sex 08/06/2019  . Breakthrough bleeding on Nexplanon 07/19/2017  . Vitamin D deficiency 01/20/2016  . Heart murmur 12/02/2013  . Vaccination not carried out because of caregiver refusal 09/10/2013  . Allergic rhinitis 09/10/2013    Past Surgical History:  Procedure Laterality Date  . MOUTH SURGERY    . TOOTH EXTRACTION  Aug 2014   severe decay    OB History   No obstetric history on file.      Home Medications    Prior to Admission medications   Not on File    Family History Family History  Problem Relation Age of Onset  . Asthma Father   . Diabetes Paternal Grandfather   . Healthy Mother     Social History Social History   Tobacco Use  . Smoking status: Never Smoker  . Smokeless tobacco: Never Used  Substance Use Topics  . Alcohol use: No  . Drug use: No     Allergies   Pork-derived products   Review of Systems Review of Systems  See HPI   Physical Exam Triage Vital Signs ED Triage Vitals [07/10/20 1609]  Enc Vitals Group     BP      Pulse      Resp      Temp      Temp src       SpO2      Weight      Height      Head Circumference      Peak Flow      Pain Score 7     Pain Loc      Pain Edu?      Excl. in GC?    No data found.  Updated Vital Signs BP 120/75 (BP Location: Left Arm)   Pulse 76   Temp 99.3 F (37.4 C) (Oral)   Resp 18   SpO2 100%   Visual Acuity Right Eye Distance:   Left Eye Distance:   Bilateral Distance:    Right Eye Near:   Left Eye Near:    Bilateral Near:     Physical Exam Gen: NAD, alert, cooperative with exam, well-appearing ENT: normal lips, normal nasal mucosa,  Eye: normal EOM, normal conjunctiva and lids CV:  RRR   Resp: no accessory muscle use, non-labored,  GI: Suprapubic tenderness, no hernia  Skin: no rashes, no areas of induration  Neuro: normal tone, normal sensation to touch Psych:  normal insight, alert and oriented MSK:  Costovertebral angle tenderness.  Neurovascular intact   UC Treatments / Results  Labs (all labs ordered are listed, but only abnormal results are displayed) Labs Reviewed  COMPREHENSIVE METABOLIC PANEL - Abnormal; Notable for the following components:      Result Value   Alkaline Phosphatase 34 (*)    All other components within normal limits  POCT URINALYSIS DIPSTICK, ED / UC - Abnormal; Notable for the following components:   Ketones, ur TRACE (*)    Hgb urine dipstick TRACE (*)    All other components within normal limits  SARS CORONAVIRUS 2 (TAT 6-24 HRS)  CBC  POC URINE PREG, ED  CERVICOVAGINAL ANCILLARY ONLY    EKG   Radiology No results found.  Procedures Procedures (including critical care time)  Medications Ordered in UC Medications - No data to display  Initial Impression / Assessment and Plan / UC Course  I have reviewed the triage vital signs and the nursing notes.  Pertinent labs & imaging results that were available during my care of the patient were reviewed by me and considered in my medical decision making (see chart for details).     Ms.  [name] is a 19 year old female that is presenting with suprapubic tenderness and left flank pain.  Less likely for nephrolithiasis.  Concern with her history of pyelonephritis that could be contributing.  Cervical cytology was obtained.  CBC, CMP was also obtained.  No findings suggestive of cystitis and urine pregnancy was negative.  Given occasions to follow-up.    Final Clinical Impressions(s) / UC Diagnoses   Final diagnoses:  Lower abdominal pain     Discharge Instructions     We will call with the results and send a medication in as indicated  Please follow up if your symptoms fail to improve.     ED Prescriptions    None     PDMP not reviewed this encounter.   Myra Rude, MD 07/10/20 [phone]

## 2020-07-10 NOTE — Telephone Encounter (Signed)
Left VM for patient. If she calls back please have her speak with a nurse/CMA and inform that her blood count does not show an infection and her kidney function is normal.  If still having pain medially consider treating empirically for pyelonephritis.  She could come to the urgent care and received ceftriaxone..   If any questions then please take the best time and phone number to call and I will try to call her back.   Myra Rude, MD [facility] 07/10/2020, 7:58 PM

## 2020-07-10 NOTE — ED Notes (Signed)
Urine and covid swab in lab

## 2020-07-10 NOTE — Discharge Instructions (Signed)
We will call with the results and send a medication in as indicated  Please follow up if your symptoms fail to improve.

## 2020-07-10 NOTE — ED Triage Notes (Cosign Needed)
Symptoms started on Monday 07/05/2020  Complains of abdominal cramping.  Patient reports nausea, no vomiting.  Reports chills No cough, no diarrhea Reports urine has been cloudy

## 2020-07-12 ENCOUNTER — Other Ambulatory Visit: Payer: Self-pay

## 2020-07-12 ENCOUNTER — Ambulatory Visit (HOSPITAL_COMMUNITY)
Admission: RE | Admit: 2020-07-12 | Discharge: 2020-07-12 | Disposition: A | Discharge status: Home / Self Care | Payer: Medicaid Other | Source: Ambulatory Visit | Attending: Family Medicine | Admitting: Family Medicine

## 2020-07-12 ENCOUNTER — Encounter (HOSPITAL_COMMUNITY): Payer: Self-pay

## 2020-07-12 VITALS — BP 101/67 | HR 80 | Temp 98.6°F | Resp 18

## 2020-07-12 DIAGNOSIS — N760 Acute vaginitis: Secondary | ICD-10-CM

## 2020-07-12 DIAGNOSIS — B9689 Other specified bacterial agents as the cause of diseases classified elsewhere: Secondary | ICD-10-CM

## 2020-07-12 LAB — CERVICOVAGINAL ANCILLARY ONLY
Bacterial Vaginitis (gardnerella): POSITIVE — AB
Candida Glabrata: NEGATIVE
Candida Vaginitis: NEGATIVE
Chlamydia: NEGATIVE
Comment: NEGATIVE
Comment: NEGATIVE
Comment: NEGATIVE
Comment: NEGATIVE
Comment: NEGATIVE
Comment: NORMAL
Neisseria Gonorrhea: NEGATIVE
Trichomonas: NEGATIVE

## 2020-07-12 MED ORDER — METRONIDAZOLE 500 MG PO TABS: 500.0000 mg | ORAL_TABLET | Freq: Two times a day (BID) | ORAL | 0 refills | Status: DC

## 2020-07-12 NOTE — ED Provider Notes (Signed)
[facility]    CSN: [id number] Arrival date & time: 07/12/20  1249      History   Chief Complaint Chief Complaint  Patient presents with  . Follow-up  . Appointment    1:00 pm    HPI Julie Cherry is a 19 y.o. female.   HPI Patient presents with concern of abnormal lab. See on 8/7 and vaginal cytology significant for bacterial vaginosis. She is in today to obtain treatment.   Past Medical History:  Diagnosis Date  . Asthma    Last had symptoms age 64.   . Chlamydia 07/09/2019  . 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 Pyelonephritis 05/27/2016    Patient Active Problem List   Diagnosis Date Noted  . 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 Left foot pain 02/16/2020  . At risk for sexually transmitted disease due to unprotected sex 08/06/2019  . Breakthrough bleeding on Nexplanon 07/19/2017  . Vitamin D deficiency 01/20/2016  . Heart murmur 12/02/2013  . Vaccination not carried out because of caregiver refusal 09/10/2013  . Allergic rhinitis 09/10/2013    Past Surgical History:  Procedure Laterality Date  . MOUTH SURGERY    . TOOTH EXTRACTION  Aug 2014   severe decay    OB History   No obstetric history on file.      Home Medications    Prior to Admission medications   Medication Sig Start Date End Date Taking? Authorizing Provider  metroNIDAZOLE (FLAGYL) 500 MG tablet Take 1 tablet (500 mg total) by mouth 2 (two) times daily. 07/12/20   Bing Neighbors, FNP    Family History Family History  Problem Relation Age of Onset  . Asthma Father   . Diabetes Paternal Grandfather   . Healthy Mother     Social History Social History   Tobacco Use  . Smoking status: Never Smoker  . Smokeless tobacco: Never Used  Substance Use Topics  . Alcohol use: No  . Drug use: No     Allergies   Pork-derived products   Review of Systems Review of Systems Pertinent negatives listed in HPI  Physical Exam Triage Vital  Signs ED Triage Vitals  Enc Vitals Group     BP 07/12/20 1334 101/67     Pulse Rate 07/12/20 1334 80     Resp 07/12/20 1334 18     Temp 07/12/20 1334 98.6 F (37 C)     Temp Source 07/12/20 1334 Oral     SpO2 07/12/20 1334 100 %     Weight --      Height --      Head Circumference --      Peak Flow --      Pain Score 07/12/20 1331 0     Pain Loc --      Pain Edu? --      Excl. in GC? --    No data found.  Updated Vital Signs BP 101/67 (BP Location: Right Arm)   Pulse 80   Temp 98.6 F (37 C) (Oral)   Resp 18   SpO2 100%   Visual Acuity Right Eye Distance:   Left Eye Distance:   Bilateral Distance:    Right Eye Near:   Left Eye Near:    Bilateral Near:     Physical Exam No exam lab discussion visit  UC Treatments / Results  Labs (all labs ordered are listed, but only abnormal results are displayed) Labs Reviewed - No data to display  EKG   Radiology No results found.  Procedures Procedures (including critical care time)  Medications Ordered in UC Medications - No data to display  Initial Impression / Assessment and Plan / UC Course  I have reviewed the triage vital signs and the nursing notes.  Pertinent labs & imaging results that were available during my care of the patient were reviewed by me and considered in my medical decision making (see chart for details).    No charge for visit due to patient was not notified of results.  Final Clinical Impressions(s) / UC Diagnoses   Final diagnoses:  Bacterial vaginosis   Discharge Instructions   None    ED Prescriptions    Medication Sig Dispense Auth. Provider   metroNIDAZOLE (FLAGYL) 500 MG tablet Take 1 tablet (500 mg total) by mouth 2 (two) times daily. 14 tablet Bing Neighbors, FNP     PDMP not reviewed this encounter.   Bing Neighbors, FNP 07/12/20 1358

## 2020-07-12 NOTE — ED Triage Notes (Signed)
Patient seen on 8/7.  Patient saw results in my chart that alarmed her.  Says she missed a call from "dr Katrinka Blazing".    Patient is feeling better

## 2020-07-19 ENCOUNTER — Encounter: Payer: Medicaid Other | Admitting: Podiatry

## 2020-07-27 ENCOUNTER — Ambulatory Visit: Payer: Medicaid Other

## 2020-07-29 DIAGNOSIS — Z03818 Encounter for observation for suspected exposure to other biological agents ruled out: Secondary | ICD-10-CM | POA: Diagnosis not present

## 2020-08-05 DIAGNOSIS — Z20822 Contact with and (suspected) exposure to covid-19: Secondary | ICD-10-CM | POA: Diagnosis not present

## 2020-08-06 ENCOUNTER — Ambulatory Visit: Payer: Medicaid Other

## 2020-08-25 DIAGNOSIS — Z20822 Contact with and (suspected) exposure to covid-19: Secondary | ICD-10-CM | POA: Diagnosis not present

## 2020-10-26 DIAGNOSIS — Z202 Contact with and (suspected) exposure to infections with a predominantly sexual mode of transmission: Secondary | ICD-10-CM | POA: Diagnosis not present

## 2020-11-19 ENCOUNTER — Ambulatory Visit (HOSPITAL_COMMUNITY)
Admission: EM | Admit: 2020-11-19 | Discharge: 2020-11-19 | Disposition: A | Discharge status: Home / Self Care | Payer: Medicaid Other | Attending: Emergency Medicine | Admitting: Emergency Medicine

## 2020-11-19 ENCOUNTER — Other Ambulatory Visit: Payer: Self-pay

## 2020-11-19 ENCOUNTER — Encounter (HOSPITAL_COMMUNITY): Payer: Self-pay

## 2020-11-19 DIAGNOSIS — J069 Acute upper respiratory infection, unspecified: Secondary | ICD-10-CM | POA: Diagnosis not present

## 2020-11-19 DIAGNOSIS — Z79899 Other long term (current) drug therapy: Secondary | ICD-10-CM | POA: Insufficient documentation

## 2020-11-19 DIAGNOSIS — R6883 Chills (without fever): Secondary | ICD-10-CM | POA: Diagnosis not present

## 2020-11-19 DIAGNOSIS — Z20822 Contact with and (suspected) exposure to covid-19: Secondary | ICD-10-CM | POA: Insufficient documentation

## 2020-11-19 DIAGNOSIS — R519 Headache, unspecified: Secondary | ICD-10-CM | POA: Diagnosis not present

## 2020-11-19 DIAGNOSIS — R059 Cough, unspecified: Secondary | ICD-10-CM | POA: Diagnosis not present

## 2020-11-19 LAB — RESP PANEL BY RT-PCR (FLU A&B, COVID) ARPGX2
Influenza A by PCR: NEGATIVE
Influenza B by PCR: NEGATIVE
SARS Coronavirus 2 by RT PCR: NEGATIVE

## 2020-11-19 LAB — POCT RAPID STREP A, ED / UC: Streptococcus, Group A Screen (Direct): NEGATIVE

## 2020-11-19 MED ORDER — BENZONATATE 100 MG PO CAPS: 200.0000 mg | ORAL_CAPSULE | Freq: Three times a day (TID) | ORAL | 0 refills | Status: DC | PRN

## 2020-11-19 MED ORDER — PROMETHAZINE-DM 6.25-15 MG/5ML PO SYRP: 5.0000 mL | ORAL_SOLUTION | Freq: Four times a day (QID) | ORAL | 0 refills | Status: DC | PRN

## 2020-11-19 NOTE — ED Notes (Signed)
This nurse entered room to offer tylenol if she has not had any earlier today. Patient on phone and does not acknowledge presence.

## 2020-11-19 NOTE — ED Triage Notes (Signed)
Pt in with c/o cough, chills, body aches and headaches that started on Monday. Also c/o ST  Pt took ibuprofen with no relief  Denies runny nose, diarrhea, N/V

## 2020-11-19 NOTE — ED Provider Notes (Signed)
[facility]    CSN: [id number] Arrival date & time: 11/19/20  1357      History   Chief Complaint Chief Complaint  Patient presents with   Chills   Cough   Headache    HPI Julie Cherry is a 19 y.o. female.   HPI   19 year old female here for evaluation of cough, chills, body aches, headache, and sore throat.  Patient reports that she has had symptoms for the past 4 days.  Patient's also had some sinus pressure and a nonproductive cough.  Patient denies fever, runny nose, ear pain or pressure, shortness of breath or wheezing, GI complaints, or changes to her sense of taste or smell.  Patient has received her Covid vaccine but she not received her flu shot.  Past Medical History:  Diagnosis Date   Asthma    Last had symptoms age 39.    Chlamydia 07/09/2019   Deliberate self-cutting 09/10/2013   Pyelonephritis 05/27/2016    Patient Active Problem List   Diagnosis Date Noted   Congenital toe deformity- third left toe 02/16/2020   Left foot pain 02/16/2020   At risk for sexually transmitted disease due to unprotected sex 08/06/2019   Breakthrough bleeding on Nexplanon 07/19/2017   Vitamin D deficiency 01/20/2016   Heart murmur 12/02/2013   Vaccination not carried out because of caregiver refusal 09/10/2013   Allergic rhinitis 09/10/2013    Past Surgical History:  Procedure Laterality Date   MOUTH SURGERY     TOOTH EXTRACTION  Aug 2014   severe decay    OB History   No obstetric history on file.      Home Medications    Prior to Admission medications   Medication Sig Start Date End Date Taking? Authorizing Provider  benzonatate (TESSALON) 100 MG capsule Take 2 capsules (200 mg total) by mouth 3 (three) times daily as needed for cough. 11/19/20   Becky Augusta, NP  metroNIDAZOLE (FLAGYL) 500 MG tablet Take 1 tablet (500 mg total) by mouth 2 (two) times daily. 07/12/20   Bing Neighbors, FNP  promethazine-dextromethorphan  (PROMETHAZINE-DM) 6.25-15 MG/5ML syrup Take 5 mLs by mouth 4 (four) times daily as needed for cough. 11/19/20   Becky Augusta, NP    Family History Family History  Problem Relation Age of Onset   Asthma Father    Diabetes Paternal Grandfather    Healthy Mother     Social History Social History   Tobacco Use   Smoking status: Never Smoker   Smokeless tobacco: Never Used  Substance Use Topics   Alcohol use: No   Drug use: No     Allergies   Pork-derived products   Review of Systems Review of Systems  Constitutional: Negative for activity change, appetite change and fever.  HENT: Positive for congestion, sinus pressure and sore throat. Negative for ear discharge, ear pain, postnasal drip, rhinorrhea and sinus pain.   Respiratory: Positive for cough. Negative for shortness of breath and wheezing.   Cardiovascular: Negative for chest pain.  Gastrointestinal: Negative for diarrhea, nausea and vomiting.  Musculoskeletal: Negative for arthralgias and myalgias.  Skin: Negative for rash.  Neurological: Positive for headaches.  Hematological: Negative.   Psychiatric/Behavioral: Negative.      Physical Exam Triage Vital Signs ED Triage Vitals  Enc Vitals Group     BP 11/19/20 1438 (!) 141/71     Pulse Rate 11/19/20 1438 (!) 108     Resp 11/19/20 1438 19     Temp  11/19/20 1438 (!) 100.9 F (38.3 C)     Temp Source 11/19/20 1438 Oral     SpO2 11/19/20 1438 100 %     Weight --      Height --      Head Circumference --      Peak Flow --      Pain Score 11/19/20 1437 10     Pain Loc --      Pain Edu? --      Excl. in GC? --    No data found.  Updated Vital Signs BP (!) 141/71 (BP Location: Right Arm)    Pulse (!) 108    Temp (!) 100.9 F (38.3 C) (Oral)    Resp 19    LMP 11/12/2020 (Exact Date)    SpO2 100%   Visual Acuity Right Eye Distance:   Left Eye Distance:   Bilateral Distance:    Right Eye Near:   Left Eye Near:    Bilateral Near:     Physical  Exam Vitals and nursing note reviewed.  Constitutional:      General: She is not in acute distress.    Appearance: She is well-developed. She is not toxic-appearing.  HENT:     Head: Normocephalic and atraumatic.     Comments: Bilateral EACs are unremarkable, bilateral TMs are pearly gray with normal light reflex, these mucosa is pink and moist without erythema, edema, or discharge.    Mouth/Throat:     Mouth: Mucous membranes are moist.     Comments: Tonsillar pillars are erythematous and edematous with white discharge. Neck:     Comments: Patient has bilateral, tender, shotty, anterior cervical lymphadenopathy. Cardiovascular:     Rate and Rhythm: Normal rate and regular rhythm.     Heart sounds: Normal heart sounds. No murmur heard. No gallop.   Pulmonary:     Effort: Pulmonary effort is normal.     Breath sounds: Normal breath sounds. No wheezing, rhonchi or rales.  Musculoskeletal:     Cervical back: Normal range of motion and neck supple.  Lymphadenopathy:     Cervical: Cervical adenopathy present.  Skin:    General: Skin is warm and dry.     Capillary Refill: Capillary refill takes less than 2 seconds.     Findings: No rash.  Neurological:     Mental Status: She is alert and oriented to person, place, and time.     GCS: GCS eye subscore is 4. GCS verbal subscore is 5. GCS motor subscore is 6.  Psychiatric:        Mood and Affect: Mood normal.        Speech: Speech normal.        Behavior: Behavior normal.      UC Treatments / Results  Labs (all labs ordered are listed, but only abnormal results are displayed) Labs Reviewed  RESP PANEL BY RT-PCR (FLU A&B, COVID) [id number]  POCT RAPID STREP A, ED / UC    EKG   Radiology No results found.  Procedures Procedures (including critical care time)  Medications Ordered in UC Medications - No data to display  Initial Impression / Assessment and Plan / UC Course  I have reviewed the triage vital signs and the  nursing notes.  Pertinent labs & imaging results that were available during my care of the patient were reviewed by me and considered in my medical decision making (see chart for details).   Patient is here for evaluation of multiple complaints.  Her complaints are consistent with both Covid and flu.  Tonsillar pillars are erythematous and edematous with white exudate.  Will send rapid strep and respiratory triplex panel.  Plan is to treat with Augmentin if patient strep positive and have her isolate at home pending the results of her Covid test.  If patient strep is negative we will continue patient isolate at home and treat symptomatically pending Covid test.  Rapid strep is negative.  Will discharge patient home with supportive care pending the results of her flu and Covid test.   Final Clinical Impressions(s) / UC Diagnoses   Final diagnoses:  Upper respiratory tract infection, unspecified type     Discharge Instructions     Isolate at home until your Covid test results are back.  If you were Covid positive you will need to quarantine for 10 days from the start of your symptoms.  After the 10 days you can break quarantine if your symptoms have improved and you have not had a fever in 24 hours.  Use the Tessalon Perles every 8 hours during the day as needed for cough.  Take them with a small sip of water.  They may give you some numbness to the base of your tongue or a metallic taste in her mouth this is normal.  Use the Promethazine DM cough syrup as needed for cough, congestion and sleep.  Gargle with warm salt water 2-3 times a day to soothe your throat and aid in pain relief.  Use Tylenol and ibuprofen as needed for fever and pain control.  Return for reevaluation or follow-up with your primary care provider if your symptoms worsen.    ED Prescriptions    Medication Sig Dispense Auth. Provider   benzonatate (TESSALON) 100 MG capsule Take 2 capsules (200 mg total) by mouth 3  (three) times daily as needed for cough. 21 capsule Becky Augusta, NP   promethazine-dextromethorphan (PROMETHAZINE-DM) 6.25-15 MG/5ML syrup Take 5 mLs by mouth 4 (four) times daily as needed for cough. 118 mL Becky Augusta, NP     PDMP not reviewed this encounter.   Becky Augusta, NP 11/19/20 1614

## 2020-11-19 NOTE — Discharge Instructions (Addendum)
Isolate at home until your Covid test results are back.  If you were Covid positive you will need to quarantine for 10 days from the start of your symptoms.  After the 10 days you can break quarantine if your symptoms have improved and you have not had a fever in 24 hours.  Use the Tessalon Perles every 8 hours during the day as needed for cough.  Take them with a small sip of water.  They may give you some numbness to the base of your tongue or a metallic taste in her mouth this is normal.  Use the Promethazine DM cough syrup as needed for cough, congestion and sleep.  Gargle with warm salt water 2-3 times a day to soothe your throat and aid in pain relief.  Use Tylenol and ibuprofen as needed for fever and pain control.  Return for reevaluation or follow-up with your primary care provider if your symptoms worsen.

## 2020-11-20 LAB — CULTURE, GROUP A STREP (THRC)

## 2020-12-07 DIAGNOSIS — Z114 Encounter for screening for human immunodeficiency virus [HIV]: Secondary | ICD-10-CM | POA: Diagnosis not present

## 2020-12-07 DIAGNOSIS — Z113 Encounter for screening for infections with a predominantly sexual mode of transmission: Secondary | ICD-10-CM | POA: Diagnosis not present

## 2020-12-15 DIAGNOSIS — Z1152 Encounter for screening for COVID-19: Secondary | ICD-10-CM | POA: Diagnosis not present

## 2021-01-31 DIAGNOSIS — B9689 Other specified bacterial agents as the cause of diseases classified elsewhere: Secondary | ICD-10-CM | POA: Diagnosis not present

## 2021-01-31 DIAGNOSIS — L0231 Cutaneous abscess of buttock: Secondary | ICD-10-CM | POA: Diagnosis not present

## 2021-02-02 ENCOUNTER — Telehealth: Payer: Self-pay

## 2021-02-02 NOTE — Telephone Encounter (Signed)
Transition Care Management Follow-up Telephone Call  Date of discharge and from where: 01/31/2021 from [facility]  How have you been since you were released from the hospital? Pt states that she is feeling better.   Any questions or concerns? No  Items Reviewed:  Did the pt receive and understand the discharge instructions provided? Yes   Medications obtained and verified? Yes   Other? No   Any new allergies since your discharge? No   Dietary orders reviewed? n/a  Do you have support at home? Yes    Functional Questionnaire: (I = Independent and D = Dependent) ADLs: I  Bathing/Dressing- I  Meal Prep- I  Eating- I  Maintaining continence- I  Transferring/Ambulation- I  Managing Meds- I   Follow up appointments reviewed:   PCP Hospital f/u appt confirmed? No    Specialist Hospital f/u appt confirmed? No    Are transportation arrangements needed? No   If their condition worsens, is the pt aware to call PCP or go to the Emergency Dept.? Yes  Was the patient provided with contact information for the PCP's office or ED? Yes  Was to pt encouraged to call back with questions or concerns? Yes

## 2021-02-22 DIAGNOSIS — Z113 Encounter for screening for infections with a predominantly sexual mode of transmission: Secondary | ICD-10-CM | POA: Diagnosis not present

## 2021-02-22 DIAGNOSIS — Z0189 Encounter for other specified special examinations: Secondary | ICD-10-CM | POA: Diagnosis not present

## 2021-02-22 DIAGNOSIS — Z118 Encounter for screening for other infectious and parasitic diseases: Secondary | ICD-10-CM | POA: Diagnosis not present

## 2021-02-23 DIAGNOSIS — L0231 Cutaneous abscess of buttock: Secondary | ICD-10-CM | POA: Diagnosis not present

## 2021-03-09 DIAGNOSIS — Z124 Encounter for screening for malignant neoplasm of cervix: Secondary | ICD-10-CM | POA: Diagnosis not present

## 2021-03-09 DIAGNOSIS — Z01419 Encounter for gynecological examination (general) (routine) without abnormal findings: Secondary | ICD-10-CM | POA: Diagnosis not present

## 2021-03-09 DIAGNOSIS — Z113 Encounter for screening for infections with a predominantly sexual mode of transmission: Secondary | ICD-10-CM | POA: Diagnosis not present

## 2021-03-09 DIAGNOSIS — R87612 Low grade squamous intraepithelial lesion on cytologic smear of cervix (LGSIL): Secondary | ICD-10-CM | POA: Diagnosis not present

## 2021-03-09 DIAGNOSIS — Z118 Encounter for screening for other infectious and parasitic diseases: Secondary | ICD-10-CM | POA: Diagnosis not present

## 2021-05-03 DIAGNOSIS — Z23 Encounter for immunization: Secondary | ICD-10-CM | POA: Diagnosis not present

## 2021-06-25 IMAGING — CR DG LUMBAR SPINE COMPLETE 4+V
4 series · 4 of 4 positions shown · non-contrast
Comparison: None.

CLINICAL DATA: Low back pain with left-sided sciatica without known
injury.

EXAM:
LUMBAR SPINE - COMPLETE 4+ VIEW

[w l-spine a.p. *]
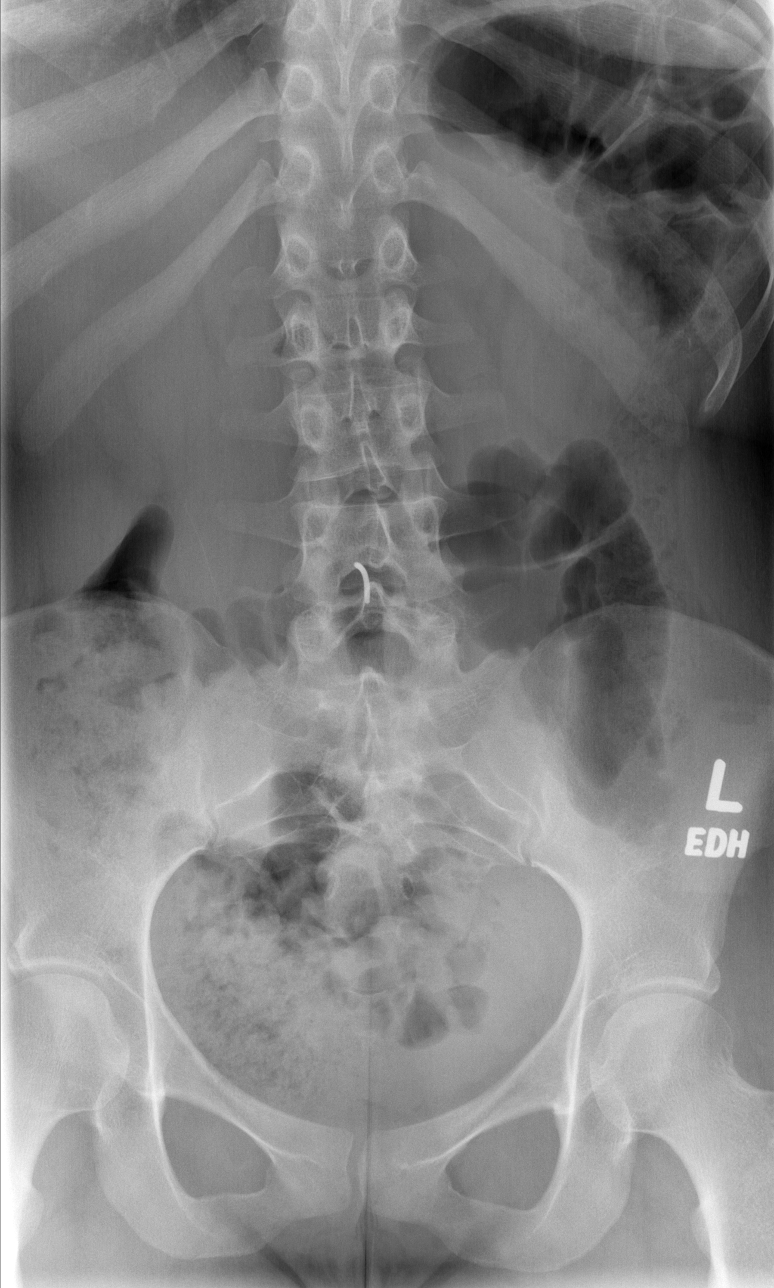

[w l-spine lat *]
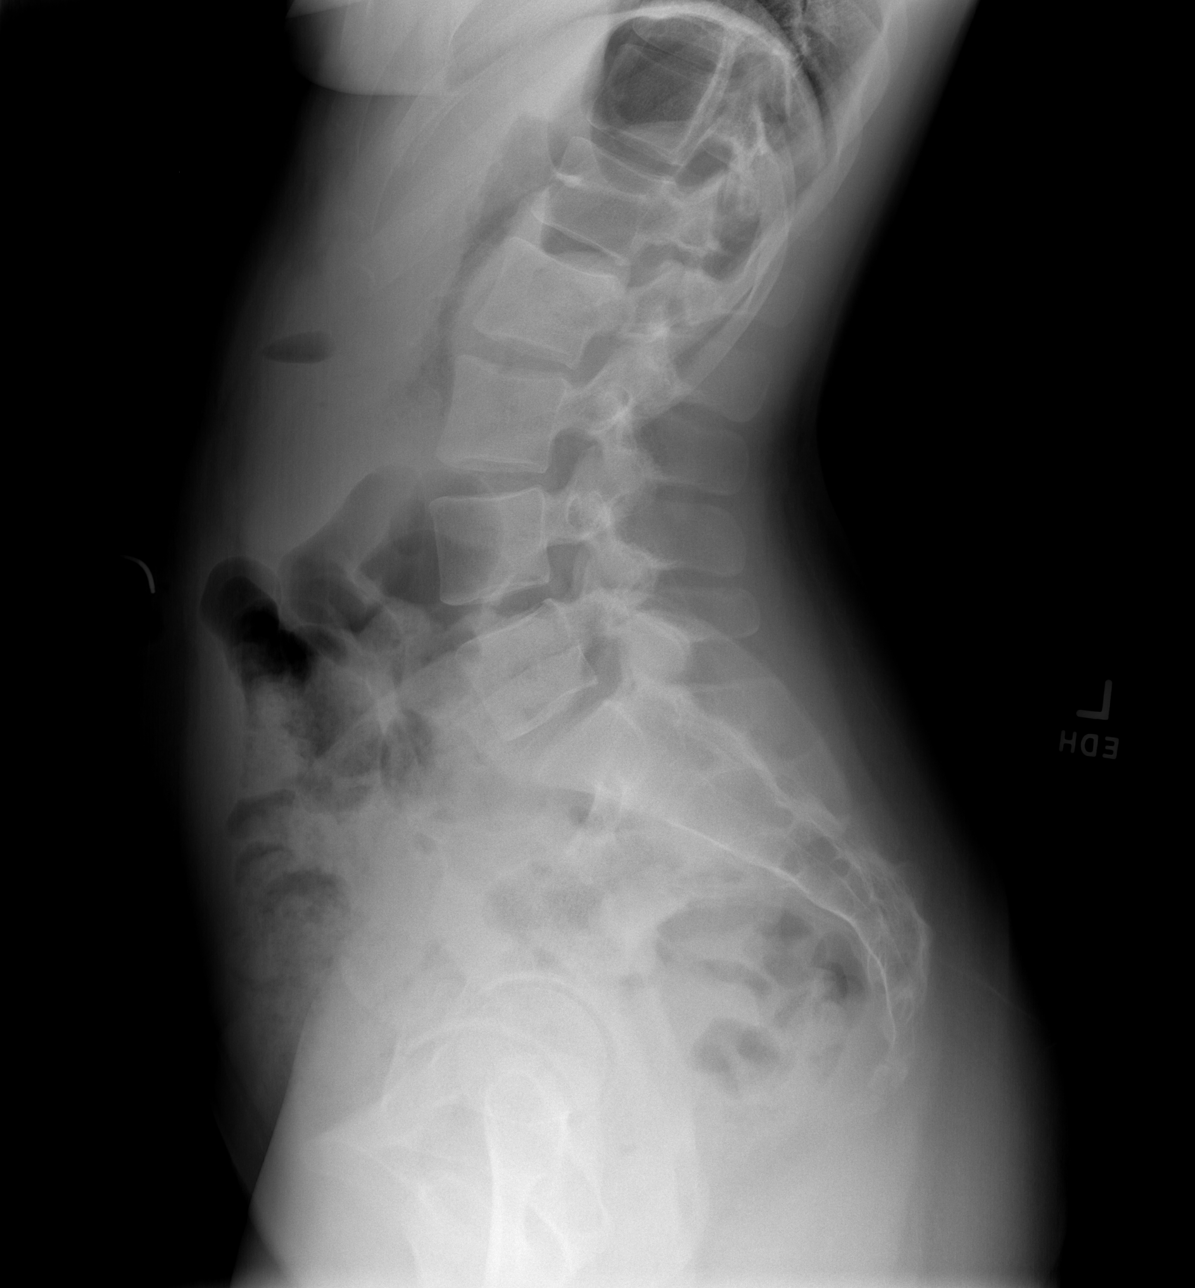

[w l-spine flexion/extension * (1 of 2)]
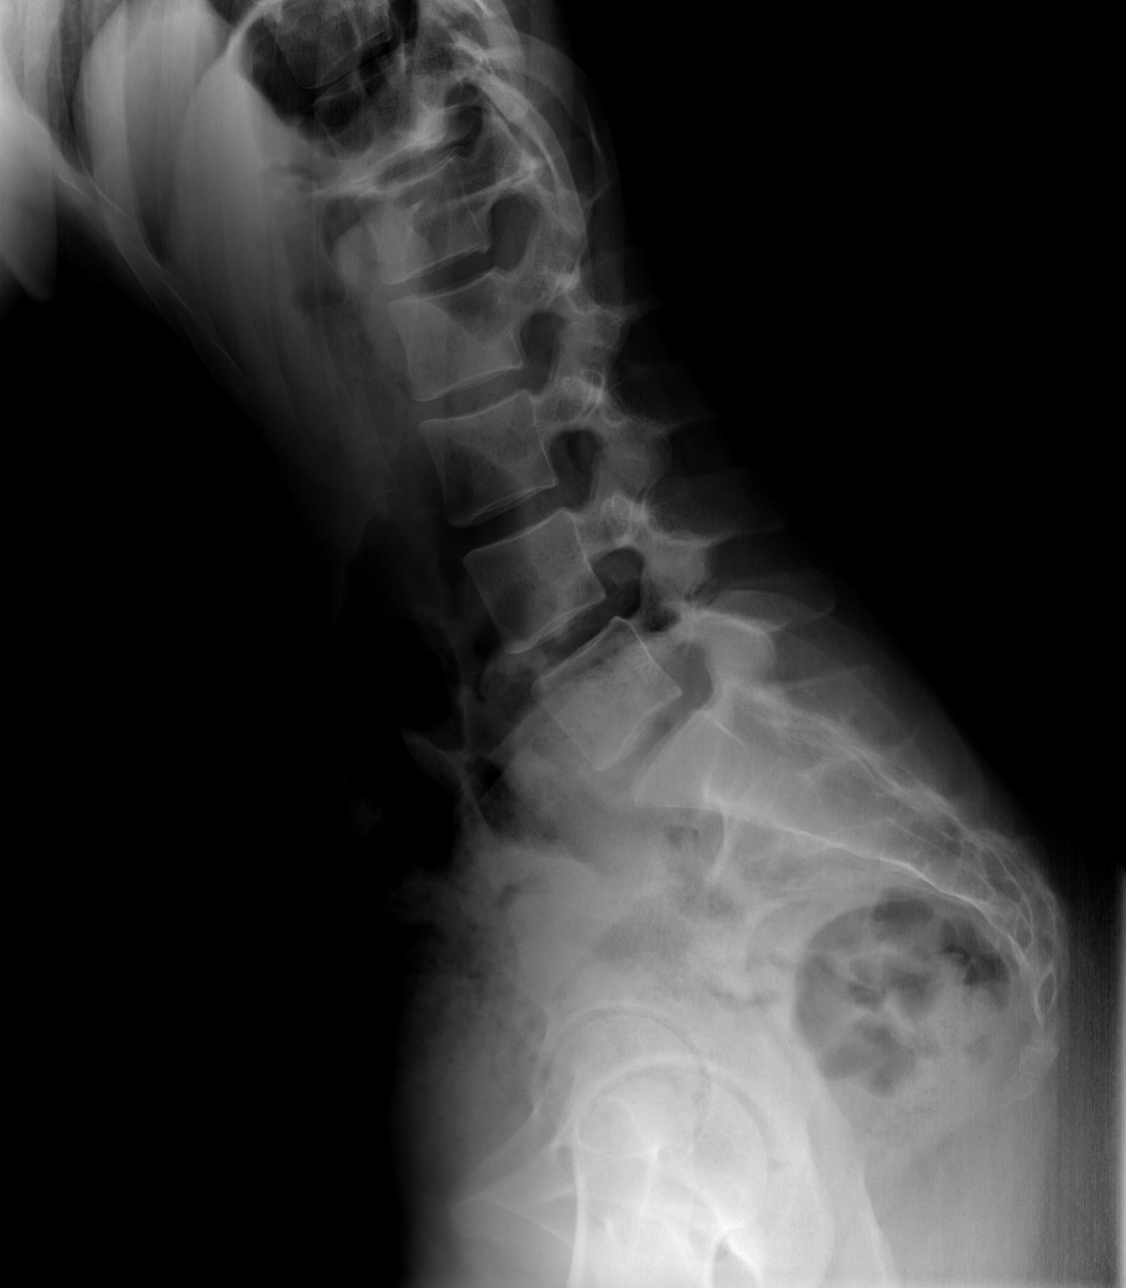

[w l-spine flexion/extension * (2 of 2)]
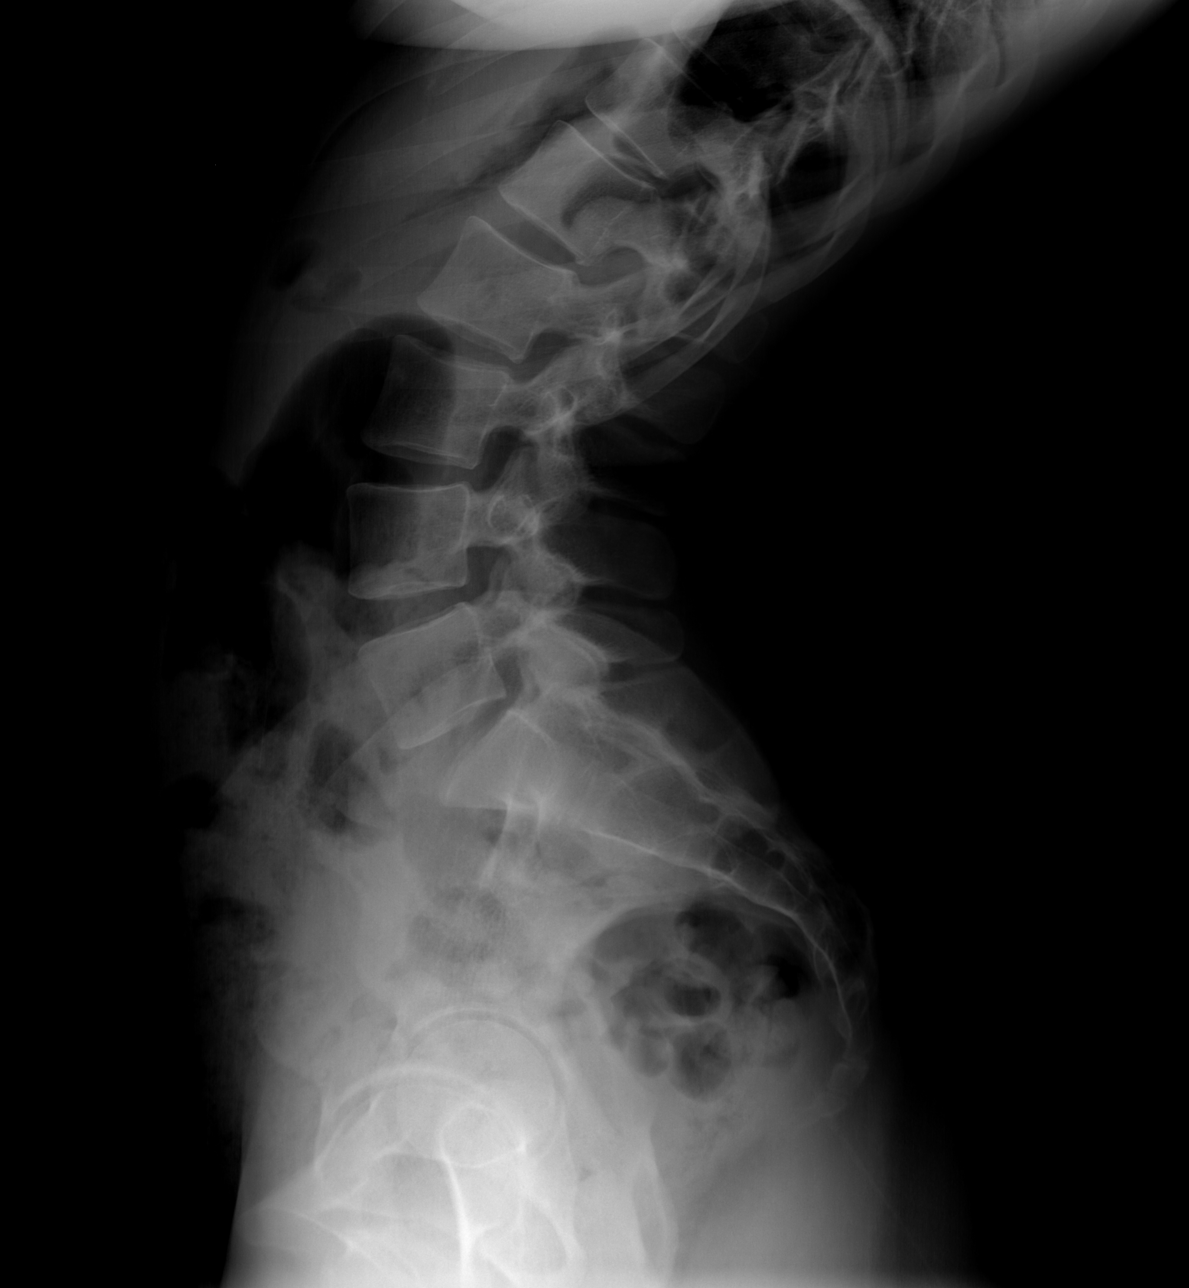

[4 of 4 positions shown; findings below may reference images not displayed]

FINDINGS: There is no evidence of lumbar spine fracture. Alignment is normal.
No change in vertebral body alignment is noted on flexion or
extension views. Intervertebral disc spaces are maintained.
IMPRESSION: Negative.

## 2021-06-30 DIAGNOSIS — Z7251 High risk heterosexual behavior: Secondary | ICD-10-CM | POA: Diagnosis not present

## 2021-06-30 DIAGNOSIS — Z3202 Encounter for pregnancy test, result negative: Secondary | ICD-10-CM | POA: Diagnosis not present

## 2021-06-30 DIAGNOSIS — R8290 Unspecified abnormal findings in urine: Secondary | ICD-10-CM | POA: Diagnosis not present

## 2021-08-21 DIAGNOSIS — R0789 Other chest pain: Secondary | ICD-10-CM | POA: Diagnosis not present

## 2021-08-21 DIAGNOSIS — R079 Chest pain, unspecified: Secondary | ICD-10-CM | POA: Diagnosis not present

## 2021-08-21 DIAGNOSIS — B349 Viral infection, unspecified: Secondary | ICD-10-CM | POA: Diagnosis not present

## 2021-10-02 DIAGNOSIS — R220 Localized swelling, mass and lump, head: Secondary | ICD-10-CM | POA: Diagnosis not present

## 2021-10-02 DIAGNOSIS — O99891 Other specified diseases and conditions complicating pregnancy: Secondary | ICD-10-CM | POA: Diagnosis not present

## 2021-10-02 DIAGNOSIS — Z3201 Encounter for pregnancy test, result positive: Secondary | ICD-10-CM | POA: Diagnosis not present

## 2021-10-02 DIAGNOSIS — Z3A00 Weeks of gestation of pregnancy not specified: Secondary | ICD-10-CM | POA: Diagnosis not present

## 2021-10-02 DIAGNOSIS — K1379 Other lesions of oral mucosa: Secondary | ICD-10-CM | POA: Diagnosis not present

## 2021-10-03 ENCOUNTER — Telehealth: Payer: Self-pay

## 2021-10-03 NOTE — Telephone Encounter (Signed)
Transition Care Management Unsuccessful Follow-up Telephone Call  Date of discharge and from where:  10/02/2021 from [facility]  Attempts:  1st Attempt  Reason for unsuccessful TCM follow-up call:  Left voice message

## 2021-10-04 NOTE — Telephone Encounter (Signed)
Transition Care Management Unsuccessful Follow-up Telephone Call  Date of discharge and from where:  10/02/2021 from [facility]  Attempts:  2nd Attempt  Reason for unsuccessful TCM follow-up call:  Left voice message

## 2021-10-05 NOTE — Telephone Encounter (Signed)
Transition Care Management Unsuccessful Follow-up Telephone Call  Date of discharge and from where:  10/02/2021 from [facility]  Attempts:  3rd Attempt  Reason for unsuccessful TCM follow-up call:  Unable to reach patient

## 2021-10-16 DIAGNOSIS — R102 Pelvic and perineal pain: Secondary | ICD-10-CM | POA: Diagnosis not present

## 2021-10-16 DIAGNOSIS — Z3A01 Less than 8 weeks gestation of pregnancy: Secondary | ICD-10-CM | POA: Diagnosis not present

## 2021-10-16 DIAGNOSIS — O26891 Other specified pregnancy related conditions, first trimester: Secondary | ICD-10-CM | POA: Diagnosis not present

## 2021-10-16 DIAGNOSIS — O219 Vomiting of pregnancy, unspecified: Secondary | ICD-10-CM | POA: Diagnosis not present

## 2021-10-17 ENCOUNTER — Telehealth: Payer: Self-pay

## 2021-10-17 NOTE — Telephone Encounter (Signed)
Transition Care Management Follow-up Telephone Call Date of discharge and from where: 10/16/2021 from [facility] How have you been since you were released from the hospital? Pt stated that she is feeling well and did not have any questions or concerns at this time.  Any questions or concerns? No  Items Reviewed: Did the pt receive and understand the discharge instructions provided? Yes  Medications obtained and verified? Yes  Other? No  Any new allergies since your discharge? No  Dietary orders reviewed? No Do you have support at home? Yes   Functional Questionnaire: (I = Independent and D = Dependent) ADLs: I  Bathing/Dressing- I  Meal Prep- I  Eating- I  Maintaining continence- I  Transferring/Ambulation- I  Managing Meds- I  Follow up appointments reviewed:  PCP Hospital f/u appt confirmed? No   Specialist Hospital f/u appt confirmed? Yes  Scheduled to see Deirdre Evener OBGYN on 10/18/2021. Are transportation arrangements needed? No  If their condition worsens, is the pt aware to call PCP or go to the Emergency Dept.? Yes Was the patient provided with contact information for the PCP's office or ED? Yes Was to pt encouraged to call back with questions or concerns? Yes

## 2021-10-18 DIAGNOSIS — O219 Vomiting of pregnancy, unspecified: Secondary | ICD-10-CM | POA: Diagnosis not present

## 2021-10-18 DIAGNOSIS — N912 Amenorrhea, unspecified: Secondary | ICD-10-CM | POA: Diagnosis not present

## 2021-11-06 ENCOUNTER — Other Ambulatory Visit: Payer: Self-pay

## 2021-11-06 ENCOUNTER — Inpatient Hospital Stay (HOSPITAL_COMMUNITY)
Admission: AD | Admit: 2021-11-06 | Discharge: 2021-11-06 | Disposition: A | Discharge status: Home / Self Care | Payer: Medicaid Other | Attending: Obstetrics and Gynecology | Admitting: Obstetrics and Gynecology

## 2021-11-06 ENCOUNTER — Encounter (HOSPITAL_COMMUNITY): Payer: Self-pay | Admitting: Emergency Medicine

## 2021-11-06 DIAGNOSIS — Z3A10 10 weeks gestation of pregnancy: Secondary | ICD-10-CM | POA: Insufficient documentation

## 2021-11-06 DIAGNOSIS — Z20822 Contact with and (suspected) exposure to covid-19: Secondary | ICD-10-CM | POA: Diagnosis not present

## 2021-11-06 DIAGNOSIS — O219 Vomiting of pregnancy, unspecified: Secondary | ICD-10-CM | POA: Diagnosis not present

## 2021-11-06 DIAGNOSIS — Z3A09 9 weeks gestation of pregnancy: Secondary | ICD-10-CM | POA: Diagnosis not present

## 2021-11-06 LAB — COMPREHENSIVE METABOLIC PANEL
ALT: 33 U/L (ref 0–44)
AST: 24 U/L (ref 15–41)
Albumin: 4 g/dL (ref 3.5–5.0)
Alkaline Phosphatase: 28 U/L — ABNORMAL LOW (ref 38–126)
Anion gap: 8 (ref 5–15)
BUN: 10 mg/dL (ref 6–20)
CO2: 24 mmol/L (ref 22–32)
Calcium: 9.5 mg/dL (ref 8.9–10.3)
Chloride: 102 mmol/L (ref 98–111)
Creatinine, Ser: 0.69 mg/dL (ref 0.44–1.00)
GFR, Estimated: >60 mL/min (ref >60)
Glucose, Bld: 77 mg/dL (ref 70–99)
Potassium: 3.7 mmol/L (ref 3.5–5.1)
Sodium: 134 mmol/L — ABNORMAL LOW (ref 135–145)
Total Bilirubin: 0.9 mg/dL (ref 0.3–1.2)
Total Protein: 7.9 g/dL (ref 6.5–8.1)

## 2021-11-06 LAB — URINALYSIS, ROUTINE W REFLEX MICROSCOPIC
Bilirubin Urine: NEGATIVE
Glucose, UA: NEGATIVE mg/dL
Ketones, ur: 80 mg/dL — AB
Nitrite: POSITIVE — AB
Protein, ur: 30 mg/dL — AB
Specific Gravity, Urine: 1.032 — ABNORMAL HIGH (ref 1.005–1.030)
pH: 6 (ref 5.0–8.0)

## 2021-11-06 LAB — RESP PANEL BY RT-PCR (FLU A&B, COVID) ARPGX2
Influenza A by PCR: NEGATIVE
Influenza B by PCR: NEGATIVE
SARS Coronavirus 2 by RT PCR: NEGATIVE

## 2021-11-06 MED ORDER — FAMOTIDINE 20 MG PO TABS: 20.0000 mg | ORAL_TABLET | Freq: Two times a day (BID) | ORAL | 1 refills | Status: DC

## 2021-11-06 MED ORDER — LACTATED RINGERS IV BOLUS: 1000.0000 mL | Freq: Once | INTRAVENOUS | Status: DC

## 2021-11-06 MED ORDER — LACTATED RINGERS IV BOLUS
1000.0000 mL | Freq: Once | INTRAVENOUS | Status: AC
  Administered 2021-11-06: 16:00:00 1000 mL via INTRAVENOUS

## 2021-11-06 MED ORDER — SODIUM CHLORIDE 0.9 % IV SOLN
8.0000 mg | Freq: Once | INTRAVENOUS | Status: AC
  Administered 2021-11-06: 16:00:00 8 mg via INTRAVENOUS
  Filled 2021-11-06: qty 4

## 2021-11-06 MED ORDER — FAMOTIDINE IN NACL 20-0.9 MG/50ML-% IV SOLN
20.0000 mg | Freq: Once | INTRAVENOUS | Status: AC
  Administered 2021-11-06: 16:00:00 20 mg via INTRAVENOUS
  Filled 2021-11-06: qty 50

## 2021-11-06 NOTE — ED Triage Notes (Addendum)
Pt states she is [redacted] weeks pregnant.  G1- Reports generalized abd pain, nausea, vomiting, and decreased PO intake since yesterday.

## 2021-11-06 NOTE — MAU Note (Signed)
Presents with c/o of N/V, reports unable to keep anything down.  States was given Zofran but it's not helping, last taken on Thursday.  LMP 08/28/2021

## 2021-11-06 NOTE — ED Notes (Signed)
Report called to Lanette in MAU

## 2021-11-06 NOTE — ED Provider Notes (Addendum)
Emergency Medicine Provider Triage Evaluation Note  Jayleah Natejah Frisch , a G1, P0, [redacted] weeks gestation, 20 y.o. female  was evaluated in triage.  Pt complains of emesis onset yesterday.  She has associated nausea and generalized abdominal pain.  Patient's not tried medications for her symptoms.  Patient denies chest pain, shortness of breath, fever, chills, rhinorrhea, nasal congestion, sore throat, diarrhea, vaginal bleeding, vaginal discharge.  She denies sick contacts.   Review of Systems  Positive: Generalized abdominal pain Negative: Fever, chills  Physical Exam  BP 102/69 (BP Location: Left Arm)   Pulse (!) 101   Temp 98.5 F (36.9 C) (Oral)   Resp 16   LMP 11/12/2020 (Exact Date)   SpO2 97%  Gen:   Awake, no distress   Resp:  Normal effort  MSK:   Moves extremities without difficulty    Medical Decision Making  Medically screening exam initiated at 11:30 AM.  Appropriate orders placed.  Tisha Surah Pelley was informed that the remainder of the evaluation will be completed by another provider, this initial triage assessment does not replace that evaluation, and the importance of remaining in the ED until their evaluation is complete.   Clinical Course as of 11/06/21 1554  Sun Nov 06, 2021  1128 Talked with MAU APP, Denny Peon and notified of patient's symptoms.  Denny Peon accepts patient in transfer to MAU.  Patient agreeable. [SB]    Clinical Course User Index [SB] Aubrey Voong A, PA-C    Serenitie Vinton A, PA-C 11/06/21 1130    Bartholomew Ramesh A, PA-C 11/06/21 1555    Margarita Grizzle, MD 11/08/21 1202

## 2021-11-06 NOTE — MAU Provider Note (Signed)
History     CSN: [id number]  Arrival date and time: 11/06/21 1046   Event Date/Time   First Provider Initiated Contact with Patient 11/06/21 1517      Chief Complaint  Patient presents with   Emesis   Nausea   HPI  [name] is a 20 y.o. female G1P0 @ [redacted]w[redacted]d  here in MAU with complaints of nausea and vomiting. This is not a new problem. She reports not being able to keep anything down. She has Zofran at home and tried a dose last Thursday and did not feel it helped. She has not taken anything since. She has no pain, no bleeding.   OB History     Gravida  1   Para      Term      Preterm      AB      Living         SAB      IAB      Ectopic      Multiple      Live Births              Past Medical History:  Diagnosis Date   Asthma    Last had symptoms age 72.    Chlamydia 07/09/2019   Deliberate self-cutting 09/10/2013   Pyelonephritis 05/27/2016    Past Surgical History:  Procedure Laterality Date   MOUTH SURGERY     TOOTH EXTRACTION  Aug 2014   severe decay    Family History  Problem Relation Age of Onset   Asthma Father    Diabetes Paternal Grandfather    Healthy Mother     Social History   Tobacco Use   Smoking status: Never   Smokeless tobacco: Never  Vaping Use   Vaping Use: Never used  Substance Use Topics   Alcohol use: No   Drug use: No    Allergies:  Allergies  Allergen Reactions   Pork-Derived Products     Patient does not eat pork but can have meds/products with pork     Medications Prior to Admission  Medication Sig Dispense Refill Last Dose   ondansetron (ZOFRAN-ODT) 8 MG disintegrating tablet Take 8 mg by mouth every 8 (eight) hours as needed for nausea or vomiting.   11/03/2021   benzonatate (TESSALON) 100 MG capsule Take 2 capsules (200 mg total) by mouth 3 (three) times daily as needed for cough. 21 capsule 0    metroNIDAZOLE (FLAGYL) 500 MG tablet Take 1 tablet (500 mg total) by mouth 2 (two)  times daily. 14 tablet 0    promethazine-dextromethorphan (PROMETHAZINE-DM) 6.25-15 MG/5ML syrup Take 5 mLs by mouth 4 (four) times daily as needed for cough. 118 mL 0    Results for orders placed or performed during the hospital encounter of 11/06/21 (from the past 48 hour(s))  Resp Panel by RT-PCR (Flu A&B, Covid) Nasopharyngeal Swab     Status: None   Collection Time: 11/06/21 11:29 AM   Specimen: Nasopharyngeal Swab; Nasopharyngeal(NP) swabs in vial transport medium  Result Value Ref Range   SARS Coronavirus 2 by RT PCR NEGATIVE NEGATIVE    Comment: (NOTE) SARS-CoV-2 target nucleic acids are NOT DETECTED.  The SARS-CoV-2 RNA is generally detectable in upper respiratory specimens during the acute phase of infection. The lowest concentration of SARS-CoV-2 viral copies this assay can detect is 138 copies/mL. A negative result does not preclude SARS-Cov-2 infection and should not be used as the sole basis for treatment  or other patient management decisions. A negative result may occur with  improper specimen collection/handling, submission of specimen other than nasopharyngeal swab, presence of viral mutation(s) within the areas targeted by this assay, and inadequate number of viral copies(<138 copies/mL). A negative result must be combined with clinical observations, patient history, and epidemiological information. The expected result is Negative.  Fact Sheet for Patients:  BloggerCourse.com  Fact Sheet for Healthcare Providers:  SeriousBroker.it  This test is no t yet approved or cleared by the [place] FDA and  has been authorized for detection and/or diagnosis of SARS-CoV-2 by FDA under an Emergency Use Authorization (EUA). This EUA will remain  in effect (meaning this test can be used) for the duration of the COVID-19 declaration under Section 564(b)(1) of the Act, 21 U.S.C.section 360bbb-3(b)(1), unless the authorization  is terminated  or revoked sooner.       Influenza A by PCR NEGATIVE NEGATIVE   Influenza B by PCR NEGATIVE NEGATIVE    Comment: (NOTE) The Xpert Xpress SARS-CoV-2/FLU/RSV plus assay is intended as an aid in the diagnosis of influenza from Nasopharyngeal swab specimens and should not be used as a sole basis for treatment. Nasal washings and aspirates are unacceptable for Xpert Xpress SARS-CoV-2/FLU/RSV testing.  Fact Sheet for Patients: BloggerCourse.com  Fact Sheet for Healthcare Providers: SeriousBroker.it  This test is not yet approved or cleared by the [place] FDA and has been authorized for detection and/or diagnosis of SARS-CoV-2 by FDA under an Emergency Use Authorization (EUA). This EUA will remain in effect (meaning this test can be used) for the duration of the COVID-19 declaration under Section 564(b)(1) of the Act, 21 U.S.C. section 360bbb-3(b)(1), unless the authorization is terminated or revoked.  Performed at [facility], [address] [place], [place] [address]   Urinalysis, Routine w reflex microscopic Urine, Clean Catch     Status: Abnormal   Collection Time: 11/06/21  1:37 PM  Result Value Ref Range   Color, Urine AMBER (A) YELLOW    Comment: BIOCHEMICALS MAY BE AFFECTED BY COLOR   APPearance HAZY (A) CLEAR   Specific Gravity, Urine 1.032 (H) 1.005 - 1.030   pH 6.0 5.0 - 8.0   Glucose, UA NEGATIVE NEGATIVE mg/dL   Hgb urine dipstick SMALL (A) NEGATIVE   Bilirubin Urine NEGATIVE NEGATIVE   Ketones, ur 80 (A) NEGATIVE mg/dL   Protein, ur 30 (A) NEGATIVE mg/dL   Nitrite POSITIVE (A) NEGATIVE   Leukocytes,Ua TRACE (A) NEGATIVE   RBC / HPF 6-10 0 - 5 RBC/hpf   WBC, UA 21-50 0 - 5 WBC/hpf   Bacteria, UA MANY (A) NONE SEEN   Squamous Epithelial / LPF 0-5 0 - 5   Mucus PRESENT     Comment: Performed at [facility], [address] [place], [place] [address]  Comprehensive metabolic  panel     Status: Abnormal   Collection Time: 11/06/21  3:10 PM  Result Value Ref Range   Sodium 134 (L) 135 - 145 mmol/L   Potassium 3.7 3.5 - 5.1 mmol/L   Chloride 102 98 - 111 mmol/L   CO2 24 22 - 32 mmol/L   Glucose, Bld 77 70 - 99 mg/dL    Comment: Glucose reference range applies only to samples taken after fasting for at least 8 hours.   BUN 10 6 - 20 mg/dL   Creatinine, Ser 1.27 0.44 - 1.00 mg/dL   Calcium 9.5 8.9 - 51.7 mg/dL   Total Protein  7.9 6.5 - 8.1 g/dL   Albumin 4.0 3.5 - 5.0 g/dL   AST 24 15 - 41 U/L   ALT 33 0 - 44 U/L   Alkaline Phosphatase 28 (L) 38 - 126 U/L   Total Bilirubin 0.9 0.3 - 1.2 mg/dL   GFR, Estimated >01 >74 mL/min    Comment: (NOTE) Calculated using the CKD-EPI Creatinine Equation (2021)    Anion gap 8 5 - 15    Comment: Performed at [facility], [address] [place], [place] [address]     Review of Systems  Gastrointestinal:  Positive for nausea and vomiting. Negative for abdominal pain and diarrhea.  Genitourinary:  Negative for difficulty urinating, dysuria, flank pain, frequency, urgency, vaginal bleeding and vaginal discharge.  Physical Exam   Blood pressure 119/61, pulse (!) 57, temperature 98.5 F (36.9 C), temperature source Oral, resp. rate 18, height 5\' 3"  (1.6 m), weight 57.6 kg, last menstrual period 08/28/2021, SpO2 99 %.  Physical Exam Constitutional:      General: She is not in acute distress.    Appearance: Normal appearance. She is not ill-appearing, toxic-appearing or diaphoretic.  HENT:     Head: Normocephalic.  Musculoskeletal:        General: Normal range of motion.  Skin:    General: Skin is warm.  Neurological:     Mental Status: She is alert and oriented to person, place, and time.   MAU Course  Procedures  MDM  + fetal heart tones via doppler  LR bolus X 1 Zofran & Pepcid given. Patient tolerating oral fluids and crackers.  Urine culture pending.   Assessment and Plan   A:  1. Nausea and  vomiting during pregnancy   2. [redacted] weeks gestation of pregnancy      P:  Discharge home in stable condition Return to MAU if symptoms worsen Rx: Pepcid Small, bland meals Increase oral fluid intake.  08/30/2021 I, NP 11/06/2021 7:07 PM

## 2021-11-09 LAB — CULTURE, OB URINE: Culture: >=100000 — AB

## 2021-11-10 ENCOUNTER — Encounter: Payer: Self-pay | Admitting: Family Medicine

## 2021-11-10 ENCOUNTER — Other Ambulatory Visit: Payer: Self-pay | Admitting: Family Medicine

## 2021-11-10 MED ORDER — CEFADROXIL 500 MG PO CAPS: 500.0000 mg | ORAL_CAPSULE | Freq: Two times a day (BID) | ORAL | 0 refills | Status: AC

## 2021-11-10 NOTE — Progress Notes (Signed)
Asymptomatic bacteriuria treatment

## 2021-11-15 DIAGNOSIS — R87612 Low grade squamous intraepithelial lesion on cytologic smear of cervix (LGSIL): Secondary | ICD-10-CM | POA: Diagnosis not present

## 2021-11-16 DIAGNOSIS — Z3401 Encounter for supervision of normal first pregnancy, first trimester: Secondary | ICD-10-CM | POA: Diagnosis not present

## 2021-11-24 ENCOUNTER — Ambulatory Visit (INDEPENDENT_AMBULATORY_CARE_PROVIDER_SITE_OTHER): Payer: Medicaid Other

## 2021-11-24 DIAGNOSIS — Z3480 Encounter for supervision of other normal pregnancy, unspecified trimester: Secondary | ICD-10-CM | POA: Insufficient documentation

## 2021-11-24 MED ORDER — BLOOD PRESSURE KIT DEVI: 1.0000 | 0 refills | Status: DC | PRN

## 2021-11-24 NOTE — Progress Notes (Signed)
New OB Intake  I connected with  Julie Cherry on 11/24/21 at  2:00 PM EST by telephone Video Visit and verified that I am speaking with the correct person using two identifiers. Nurse is located at [facility] and pt is located at home.  I discussed the limitations, risks, security and privacy concerns of performing an evaluation and management service by telephone and the availability of in person appointments. I also discussed with the patient that there may be a patient responsible charge related to this service. The patient expressed understanding and agreed to proceed.  I explained I am completing New OB Intake today. We discussed her EDD of 06/09/2022 that is based on LMP of 08/28/21. Pt is G1/P0. I reviewed her allergies, medications, Medical/Surgical/OB history, and appropriate screenings. I informed her of [facility] services. Based on history, this is a  pregnancy uncomplicated .   Patient Active Problem List   Diagnosis Date Noted   Congenital toe deformity- third left toe 02/16/2020   Left foot pain 02/16/2020   At risk for sexually transmitted disease due to unprotected sex 08/06/2019   Breakthrough bleeding on Nexplanon 07/19/2017   Vitamin D deficiency 01/20/2016   Heart murmur 12/02/2013   Vaccination not carried out because of caregiver refusal 09/10/2013   Allergic rhinitis 09/10/2013    Concerns addressed today  Delivery Plans:  Plans to deliver at [facility].   MyChart/Babyscripts MyChart access verified. I explained pt will have some visits in office and some virtually. Babyscripts instructions given and order placed. Patient verifies receipt of registration text/e-mail. Account successfully created and app downloaded.  Blood Pressure Cuff  Blood pressure cuff ordered for patient to pick-up from [employer]. Explained after first prenatal appt pt will check weekly and document in Babyscripts.  Weight scale: Patient does have home weight scale.   Anatomy  US Explained first scheduled [place] will be around 19 weeks. Anatomy US to be scheduled at initial OB appointment.   Labs Discussed Avelina Laine genetic screening with patient. Would like both Panorama and Horizon drawn at new OB visit. Routine prenatal labs needed.  Covid Vaccine Patient has had covid vaccine.   Centering in Pregnancy Candidate?  If yes, offer as possibility  Mother/ Baby Dyad Candidate?    If yes, offer as possibility  Informed patient of [facility] website  and placed link in her AVS.   Social Determinants of Health Food Insecurity: Patient denies food insecurity. WIC Referral: Patient is interested in referral to [facility].  Transportation: Patient denies transportation needs. Childcare: Discussed no children allowed at ultrasound appointments. Offered childcare services; patient declines childcare services at this time.  Send link to Pregnancy Navigators   Placed OB Box on problem list and updated  First visit review I reviewed new OB appt with pt. I explained she will have a pelvic exam, ob bloodwork with genetic screening, and PAP smear. Explained pt will be seen by Dr. Clearance Coots at first visit; encounter routed to appropriate provider. Explained that patient will be seen by pregnancy navigator following visit with provider. [facility] information placed in AVS.   Julie Cherry, CMA 11/24/2021  2:10 PM

## 2021-11-30 DIAGNOSIS — Z3480 Encounter for supervision of other normal pregnancy, unspecified trimester: Secondary | ICD-10-CM | POA: Diagnosis not present

## 2021-12-04 NOTE — L&D Delivery Note (Signed)
OB/GYN Faculty Practice Delivery Note  Julie Cherry is a 21 y.o. G1P1001 s/p SVD at [redacted]w[redacted]d. She was admitted for SOL.   ROM: 9h 57m with moderate meconium stained fluid GBS Status: Negative   Delivery Date/Time: 06/10/22 at 1205  Delivery: Called to room and patient was complete and pushing. Head delivered direct occiput anterior. No nuchal cord present. Shoulders and body delivered in usual fashion. Infant with spontaneous cry, placed on mother's abdomen, dried and stimulated. Cord clamped x 2 after 1-minute delay and cut by FOB under direct supervision. Cord blood drawn. Placenta delivered spontaneously with gentle cord traction. Fundus firm with massage and Pitocin. Labia, perineum, vagina, and cervix were inspected, and patient was found to have bilateral periurethral lacerations that were superficial, hemostatic, and not repaired.   Placenta: Intact, 3VC - sent to L&D  Complications: None  Lacerations: Bilateral periurethral  EBL: 175 cc Analgesia: None   Infant: Viable female  APGARs 8 and 74  Evalina Field, MD OB/GYN Fellow, Faculty Practice

## 2021-12-22 ENCOUNTER — Encounter: Payer: Self-pay | Admitting: Obstetrics

## 2021-12-22 ENCOUNTER — Other Ambulatory Visit: Payer: Self-pay

## 2021-12-22 ENCOUNTER — Ambulatory Visit (INDEPENDENT_AMBULATORY_CARE_PROVIDER_SITE_OTHER): Payer: Medicaid Other | Admitting: Licensed Clinical Social Worker

## 2021-12-22 ENCOUNTER — Other Ambulatory Visit (HOSPITAL_COMMUNITY)
Admission: RE | Admit: 2021-12-22 | Discharge: 2021-12-22 | Disposition: A | Discharge status: Home / Self Care | Payer: Medicaid Other | Source: Ambulatory Visit | Attending: Obstetrics | Admitting: Obstetrics

## 2021-12-22 ENCOUNTER — Ambulatory Visit (INDEPENDENT_AMBULATORY_CARE_PROVIDER_SITE_OTHER): Payer: Medicaid Other | Admitting: Obstetrics

## 2021-12-22 VITALS — BP 109/61 | HR 86 | Wt 141.0 lb

## 2021-12-22 DIAGNOSIS — Z3480 Encounter for supervision of other normal pregnancy, unspecified trimester: Secondary | ICD-10-CM | POA: Insufficient documentation

## 2021-12-22 DIAGNOSIS — Z3A16 16 weeks gestation of pregnancy: Secondary | ICD-10-CM | POA: Diagnosis not present

## 2021-12-22 NOTE — Progress Notes (Signed)
Subjective:    Julie Cherry is being seen today for her first obstetrical visit.  This is not a planned pregnancy. She is at [redacted]w[redacted]d gestation. Her obstetrical history is significant for  none . Relationship with FOB: significant other, not living together. Patient does intend to breast feed. Pregnancy history fully reviewed.  The information documented in the HPI was reviewed and verified.  Menstrual History: OB History     Gravida  1   Para      Term      Preterm      AB      Living         SAB      IAB      Ectopic      Multiple      Live Births              Patient's last menstrual period was 08/28/2021 (approximate).    Past Medical History:  Diagnosis Date   Asthma    Last had symptoms age 78.    Chlamydia 07/09/2019   Deliberate self-cutting 09/10/2013   Pyelonephritis 05/27/2016    Past Surgical History:  Procedure Laterality Date   MOUTH SURGERY     TOOTH EXTRACTION  Aug 2014   severe decay    (Not in a hospital admission)  Allergies  Allergen Reactions   Pork-Derived Products     Patient does not eat pork but can have meds/products with pork     Social History   Tobacco Use   Smoking status: Never   Smokeless tobacco: Never  Substance Use Topics   Alcohol use: No    Family History  Problem Relation Age of Onset   Asthma Father    Diabetes Paternal Grandfather    Healthy Mother      Review of Systems Constitutional: negative for weight loss Gastrointestinal: negative for vomiting Genitourinary:negative for genital lesions and vaginal discharge and dysuria Musculoskeletal:negative for back pain Behavioral/Psych: negative for abusive relationship, depression, illegal drug usage and tobacco use    Objective:    BP 109/61    Pulse 86    Wt 141 lb (64 kg)    LMP 08/28/2021 (Approximate)    BMI 24.98 kg/m  General Appearance:    Alert, cooperative, no distress, appears stated age  Head:    Normocephalic, without obvious  abnormality, atraumatic  Eyes:    PERRL, conjunctiva/corneas clear, EOM's intact, fundi    benign, both eyes  Ears:    Normal TM's and external ear canals, both ears  Nose:   Nares normal, septum midline, mucosa normal, no drainage    or sinus tenderness  Throat:   Lips, mucosa, and tongue normal; teeth and gums normal  Neck:   Supple, symmetrical, trachea midline, no adenopathy;    thyroid:  no enlargement/tenderness/nodules; no carotid   bruit or JVD  Back:     Symmetric, no curvature, ROM normal, no CVA tenderness  Lungs:     Clear to auscultation bilaterally, respirations unlabored  Chest Wall:    No tenderness or deformity   Heart:    Regular rate and rhythm, S1 and S2 normal, no murmur, rub   or gallop  Breast Exam:    No tenderness, masses, or nipple abnormality  Abdomen:     Soft, non-tender, bowel sounds active all four quadrants,    no masses, no organomegaly  Genitalia:    Normal female without lesion, discharge or tenderness  Extremities:   Extremities normal,  atraumatic, no cyanosis or edema  Pulses:   2+ and symmetric all extremities  Skin:   Skin color, texture, turgor normal, no rashes or lesions  Lymph nodes:   Cervical, supraclavicular, and axillary nodes normal  Neurologic:   CNII-XII intact, normal strength, sensation and reflexes    throughout      Lab Review Urine pregnancy test Labs reviewed yes Radiologic studies reviewed no  Assessment:    Pregnancy at [redacted]w[redacted]d weeks    Plan:    1. Supervision of other normal pregnancy, antepartum Rx: - Cytology - PAP( [place]) - Cervicovaginal ancillary only( [place]) - CBC/D/Plt+RPR+Rh+ABO+RubIgG... - Culture, OB Urine - Genetic Screening - US MFM OB COMP + 14 WK; Future - AFP, Serum, Open Spina Bifida   Prenatal vitamins.  Counseling provided regarding continued use of seat belts, cessation of alcohol consumption, smoking or use of illicit drugs; infection precautions i.e., influenza/TDAP immunizations,  toxoplasmosis,CMV, parvovirus, listeria and varicella; workplace safety, exercise during pregnancy; routine dental care, safe medications, sexual activity, hot tubs, saunas, pools, travel, caffeine use, fish and methlymercury, potential toxins, hair treatments, varicose veins Weight gain recommendations per IOM guidelines reviewed: underweight/BMI< 18.5--> gain 28 - 40 lbs; normal weight/BMI 18.5 - 24.9--> gain 25 - 35 lbs; overweight/BMI 25 - 29.9--> gain 15 - 25 lbs; obese/BMI >30->gain  11 - 20 lbs Problem list reviewed and updated. FIRST/CF mutation testing/NIPT/QUAD SCREEN/fragile X/Ashkenazi Jewish population testing/Spinal muscular atrophy discussed: requested. Role of ultrasound in pregnancy discussed; fetal survey: requested. Amniocentesis discussed: not indicated.  No orders of the defined types were placed in this encounter.  Orders Placed This Encounter  Procedures   Culture, OB Urine   [place] MFM OB COMP + 14 WK    Standing Status:   Future    Standing Expiration Date:   12/22/2022    Order Specific Question:   Reason for Exam (SYMPTOM  OR DIAGNOSIS REQUIRED)    Answer:   Anatomy    Order Specific Question:   Preferred Location    Answer:   [facility] Ultrasound   CBC/D/Plt+RPR+Rh+ABO+RubIgG...   Genetic Screening   AFP, Serum, Open Spina Bifida    Order Specific Question:   Is patient insulin dependent?    Answer:   No    Order Specific Question:   Weight (lbs)    Answer:   32    Order Specific Question:   Gestational Age (GA), weeks    Answer:   16.4    Order Specific Question:   Date on which patient was at this GA    Answer:   12/22/2021    Order Specific Question:   GA Calculation Method    Answer:   LMP    Order Specific Question:   GA Date    Answer:   06/04/2022    Order Specific Question:   Number of fetuses    Answer:   1    Follow up in 4 weeks.  I have spent a total of 20 minutes of face-to-face time, excluding clinical staff time, reviewing notes and preparing  to see patient, ordering tests and/or medications, and counseling the patient.   Brock Bad, MD 12/22/2021 2:16 PM

## 2021-12-22 NOTE — BH Specialist Note (Signed)
Integrated Behavioral Health Initial In-Person Visit  MRN: [id number] Name: Julie Cherry  Number of Integrated Behavioral Health Clinician visits:: 1/6 Session Start time: 200pm  Session End time: 218pm Total time: 18 minutes in person at [facility]   Types of Service: Individual psychotherapy and General Behavioral Integrated Care (BHI)  Interpretor:No. Interpretor Name and Language: None   Warm Hand Off Completed.        Subjective: [name] is a 21 y.o. female accompanied by Partner/Significant Other and Father of child Patient was referred by Aron Baba MD for New ob intro.   Ms. Biggar was accompanied by father of baby. Ms. Rawles did not appear in distress and reports positive and stable mood. Ms. Tristan and LCSW A. Felton Clinton discuss [employer] and services.  Gwyndolyn Saxon, LCSW

## 2021-12-23 LAB — CERVICOVAGINAL ANCILLARY ONLY
Bacterial Vaginitis (gardnerella): POSITIVE — AB
Candida Glabrata: NEGATIVE
Candida Vaginitis: NEGATIVE
Chlamydia: NEGATIVE
Comment: NEGATIVE
Comment: NEGATIVE
Comment: NEGATIVE
Comment: NEGATIVE
Comment: NEGATIVE
Comment: NORMAL
Neisseria Gonorrhea: NEGATIVE
Trichomonas: NEGATIVE

## 2021-12-24 LAB — URINE CULTURE, OB REFLEX: Organism ID, Bacteria: NO GROWTH

## 2021-12-24 LAB — CULTURE, OB URINE

## 2021-12-26 ENCOUNTER — Other Ambulatory Visit: Payer: Self-pay | Admitting: Obstetrics

## 2021-12-26 DIAGNOSIS — B9689 Other specified bacterial agents as the cause of diseases classified elsewhere: Secondary | ICD-10-CM

## 2021-12-26 LAB — CYTOLOGY - PAP: Diagnosis: HIGH — AB

## 2021-12-26 MED ORDER — METRONIDAZOLE 500 MG PO TABS: 500.0000 mg | ORAL_TABLET | Freq: Two times a day (BID) | ORAL | 2 refills | Status: DC

## 2021-12-26 NOTE — Progress Notes (Signed)
fla 

## 2021-12-27 ENCOUNTER — Encounter: Payer: Self-pay | Admitting: *Deleted

## 2021-12-27 ENCOUNTER — Other Ambulatory Visit: Payer: Self-pay | Admitting: *Deleted

## 2021-12-27 NOTE — Progress Notes (Signed)
Patient notified. Does not want to schedule colpo until speaking with provider. Concerns for pregnancy.

## 2021-12-27 NOTE — Progress Notes (Signed)
Patient notified of positive BV and abnormal Pap smear and recommendation for colposcopy. Notified of RX for BV. Patient verbalized understanding and will pick up RX. Wants to talk with provider prior to scheduling colposcopy. Concerns for pregnancy. MyChart message with education on BV and colposcopy sent.

## 2021-12-27 NOTE — Progress Notes (Signed)
TC from Julie Cherry reporting positive RPR 1:16 for this patient. Labs drawn 12/22/21. Not resulted in Epic as of 12/27/21. Mellody Dance to follow up with [facility]. STI report to [facility] completed. Patient notified of need for treatment for both partners at [facility]. Verbalized understanding.

## 2021-12-28 LAB — CBC/D/PLT+RPR+RH+ABO+RUBIGG...
Antibody Screen: NEGATIVE
Basophils Absolute: 0 10*3/uL (ref 0.0–0.2)
Basos: 0 %
EOS (ABSOLUTE): 0.2 10*3/uL (ref 0.0–0.4)
Eos: 1 %
HCV Ab: <0.1 s/co ratio (ref 0.0–0.9)
HIV Screen 4th Generation wRfx: NONREACTIVE
Hematocrit: 34.1 % (ref 34.0–46.6)
Hemoglobin: 11.2 g/dL (ref 11.1–15.9)
Hepatitis B Surface Ag: NEGATIVE
Immature Grans (Abs): 0.1 10*3/uL (ref 0.0–0.1)
Immature Granulocytes: 1 %
Lymphocytes Absolute: 2 10*3/uL (ref 0.7–3.1)
Lymphs: 18 %
MCH: 26.7 pg (ref 26.6–33.0)
MCHC: 32.8 g/dL (ref 31.5–35.7)
MCV: 81 fL (ref 79–97)
Monocytes Absolute: 1.1 10*3/uL — ABNORMAL HIGH (ref 0.1–0.9)
Monocytes: 9 %
Neutrophils Absolute: 8.2 10*3/uL — ABNORMAL HIGH (ref 1.4–7.0)
Neutrophils: 71 %
Platelets: 312 10*3/uL (ref 150–450)
RBC: 4.19 x10E6/uL (ref 3.77–5.28)
RDW: 14.3 % (ref 11.7–15.4)
RPR Ser Ql: REACTIVE — AB
Rh Factor: POSITIVE
Rubella Antibodies, IGG: 2.86 index (ref >0.99)
WBC: 11.6 10*3/uL — ABNORMAL HIGH (ref 3.4–10.8)

## 2021-12-28 LAB — AFP, SERUM, OPEN SPINA BIFIDA
AFP MoM: 1.12
AFP Value: 48.6 ng/mL
Gest. Age on Collection Date: 16.6 weeks
Maternal Age At EDD: 22.6 yr
OSBR Risk 1 IN: 10000
Test Results:: NEGATIVE
Weight: 141 [lb_av]

## 2021-12-28 LAB — RPR, QUANT+TP ABS (REFLEX)
Rapid Plasma Reagin, Quant: 1:16 {titer} — ABNORMAL HIGH
T Pallidum Abs: REACTIVE — AB

## 2021-12-28 LAB — HCV INTERPRETATION

## 2021-12-29 ENCOUNTER — Encounter: Payer: Self-pay | Admitting: *Deleted

## 2021-12-29 ENCOUNTER — Other Ambulatory Visit: Payer: Self-pay | Admitting: *Deleted

## 2021-12-29 DIAGNOSIS — O98112 Syphilis complicating pregnancy, second trimester: Secondary | ICD-10-CM | POA: Insufficient documentation

## 2021-12-29 DIAGNOSIS — O98113 Syphilis complicating pregnancy, third trimester: Secondary | ICD-10-CM | POA: Insufficient documentation

## 2021-12-29 DIAGNOSIS — A530 Latent syphilis, unspecified as early or late: Secondary | ICD-10-CM | POA: Diagnosis not present

## 2021-12-29 DIAGNOSIS — Z114 Encounter for screening for human immunodeficiency virus [HIV]: Secondary | ICD-10-CM | POA: Diagnosis not present

## 2021-12-29 DIAGNOSIS — Z331 Pregnant state, incidental: Secondary | ICD-10-CM | POA: Diagnosis not present

## 2021-12-29 DIAGNOSIS — Z113 Encounter for screening for infections with a predominantly sexual mode of transmission: Secondary | ICD-10-CM | POA: Diagnosis not present

## 2021-12-29 NOTE — Progress Notes (Signed)
Letter faxed to [facility] Department giving permission to treat patient for syphilis. Signed by Dr. Clearance Coots.

## 2021-12-30 NOTE — Progress Notes (Signed)
Patient previously notified of need for colpo. Refused to schedule until speaking with a provider.

## 2022-01-03 ENCOUNTER — Encounter: Payer: Self-pay | Admitting: Obstetrics

## 2022-01-03 ENCOUNTER — Other Ambulatory Visit: Payer: Self-pay

## 2022-01-03 DIAGNOSIS — D563 Thalassemia minor: Secondary | ICD-10-CM

## 2022-01-05 DIAGNOSIS — A530 Latent syphilis, unspecified as early or late: Secondary | ICD-10-CM | POA: Diagnosis not present

## 2022-01-09 ENCOUNTER — Ambulatory Visit: Payer: Medicaid Other | Admitting: *Deleted

## 2022-01-09 ENCOUNTER — Ambulatory Visit: Payer: Medicaid Other | Attending: Obstetrics

## 2022-01-09 ENCOUNTER — Encounter: Payer: Self-pay | Admitting: *Deleted

## 2022-01-09 ENCOUNTER — Other Ambulatory Visit: Payer: Self-pay

## 2022-01-09 VITALS — BP 111/55 | HR 85

## 2022-01-09 DIAGNOSIS — Z3A19 19 weeks gestation of pregnancy: Secondary | ICD-10-CM | POA: Insufficient documentation

## 2022-01-09 DIAGNOSIS — Z148 Genetic carrier of other disease: Secondary | ICD-10-CM | POA: Insufficient documentation

## 2022-01-09 DIAGNOSIS — O98112 Syphilis complicating pregnancy, second trimester: Secondary | ICD-10-CM | POA: Diagnosis not present

## 2022-01-09 DIAGNOSIS — Z363 Encounter for antenatal screening for malformations: Secondary | ICD-10-CM | POA: Diagnosis not present

## 2022-01-09 DIAGNOSIS — Z3480 Encounter for supervision of other normal pregnancy, unspecified trimester: Secondary | ICD-10-CM

## 2022-01-09 DIAGNOSIS — Z3689 Encounter for other specified antenatal screening: Secondary | ICD-10-CM | POA: Diagnosis not present

## 2022-01-09 DIAGNOSIS — D563 Thalassemia minor: Secondary | ICD-10-CM

## 2022-01-11 ENCOUNTER — Other Ambulatory Visit: Payer: Self-pay | Admitting: *Deleted

## 2022-01-11 DIAGNOSIS — A530 Latent syphilis, unspecified as early or late: Secondary | ICD-10-CM

## 2022-01-11 DIAGNOSIS — Z3689 Encounter for other specified antenatal screening: Secondary | ICD-10-CM

## 2022-01-12 ENCOUNTER — Other Ambulatory Visit: Payer: Self-pay

## 2022-01-12 ENCOUNTER — Ambulatory Visit (INDEPENDENT_AMBULATORY_CARE_PROVIDER_SITE_OTHER): Payer: Medicaid Other | Admitting: Obstetrics

## 2022-01-12 VITALS — BP 111/62 | HR 88 | Wt 144.7 lb

## 2022-01-12 DIAGNOSIS — O98112 Syphilis complicating pregnancy, second trimester: Secondary | ICD-10-CM

## 2022-01-12 DIAGNOSIS — A530 Latent syphilis, unspecified as early or late: Secondary | ICD-10-CM | POA: Diagnosis not present

## 2022-01-12 DIAGNOSIS — R87613 High grade squamous intraepithelial lesion on cytologic smear of cervix (HGSIL): Secondary | ICD-10-CM

## 2022-01-12 DIAGNOSIS — Z3480 Encounter for supervision of other normal pregnancy, unspecified trimester: Secondary | ICD-10-CM

## 2022-01-12 NOTE — Progress Notes (Signed)
Patient presents for ROB and to discuss pap results and need for colpo.

## 2022-01-12 NOTE — Progress Notes (Signed)
Patient presents for ROB. Is receiving tx for syphillis, getting third dose today. Made aware of necessity to schedule colpo. Has no other concerns. Barkley Boards, RN

## 2022-01-12 NOTE — Progress Notes (Signed)
Subjective:  Julie Cherry is a 21 y.o. G1P0 at [redacted]w[redacted]d being seen today for ongoing prenatal care.  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  Patient reports no complaints.  Contractions: Not present. Vag. Bleeding: None.  Movement: Present. Denies leaking of fluid.   The following portions of the patient's history were reviewed and updated as appropriate: allergies, current medications, past family history, past medical history, past social history, past surgical history and problem list. Problem list updated.  Objective:   Vitals:   01/12/22 1333  BP: 111/62  Pulse: 88  Weight: 144 lb 11.2 oz (65.6 kg)    Fetal Status:     Movement: Present     General:  Alert, oriented and cooperative. Patient is in no acute distress.  Skin: Skin is warm and dry. No rash noted.   Cardiovascular: Normal heart rate noted  Respiratory: Normal respiratory effort, no problems with respiration noted  Abdomen: Soft, gravid, appropriate for gestational age. Pain/Pressure: Absent     Pelvic:  Cervical exam deferred        Extremities: Normal range of motion.  Edema: None  Mental Status: Normal mood and affect. Normal behavior. Normal judgment and thought content.   Urinalysis:      Assessment and Plan:  Pregnancy: G1P0 at [redacted]w[redacted]d  1. Supervision of other normal pregnancy, antepartum-   2. 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 HGSIL (high grade squamous  intraepithelial lesion) on Pap smear of cervix - has been noncompliant with coming for scheduled colpo.  I explained to her what is involved withn the colpo procedure in pregnancy, and that biopsies are not usually done, but we need to document the colposcopic state of her cervix now, and biopsies will be done 3-4 months  postpartum  Preterm labor symptoms and general obstetric precautions including but not limited to vaginal bleeding, contractions, leaking of fluid and fetal movement were reviewed in detail with the patient. Please refer to After Visit Summary for other counseling recommendations.   Return in about 4 weeks (around 02/09/2022) for ROB.  She also needs colpo scheduled.Brock Bad, MD  01/12/22

## 2022-01-16 ENCOUNTER — Ambulatory Visit (HOSPITAL_COMMUNITY)
Admission: EM | Admit: 2022-01-16 | Discharge: 2022-01-16 | Disposition: A | Discharge status: Home / Self Care | Payer: Medicaid Other | Attending: Emergency Medicine | Admitting: Emergency Medicine

## 2022-01-16 ENCOUNTER — Encounter (HOSPITAL_COMMUNITY): Payer: Self-pay

## 2022-01-16 ENCOUNTER — Other Ambulatory Visit: Payer: Self-pay

## 2022-01-16 ENCOUNTER — Encounter: Payer: Medicaid Other | Admitting: Obstetrics and Gynecology

## 2022-01-16 DIAGNOSIS — N760 Acute vaginitis: Secondary | ICD-10-CM | POA: Diagnosis not present

## 2022-01-16 LAB — POCT URINALYSIS DIPSTICK, ED / UC
Bilirubin Urine: NEGATIVE
Glucose, UA: NEGATIVE mg/dL
Nitrite: NEGATIVE
Protein, ur: NEGATIVE mg/dL
Specific Gravity, Urine: 1.02 (ref 1.005–1.030)
Urobilinogen, UA: 0.2 mg/dL (ref 0.0–1.0)
pH: 6.5 (ref 5.0–8.0)

## 2022-01-16 MED ORDER — TERCONAZOLE 0.4 % VA CREA
TOPICAL_CREAM | VAGINAL | 0 refills | Status: DC
Start: 2022-01-16 — End: 2022-01-27

## 2022-01-16 NOTE — Discharge Instructions (Addendum)
The results of your STD testing today will be made available to you once they are complete, this typically takes 3 to 5 days.  They will initially be posted to your MyChart and, if any of your results are abnormal, you will receive a phone call with those results along with further instructions regarding treatment and any prescriptions, if needed.   I provided you with a prescription for terconazole cream that you can apply externally to any vaginal areas that are itching or irritated.  Please be sure you reach out to your obstetrician about your symptoms, particularly if they are are any positive findings and medications are prescribed.  Is important that your obstetrician knows everything that is going on with you during her pregnancy.   Thank you for visiting urgent care today.  I appreciate the opportunity to participate in your care.

## 2022-01-16 NOTE — ED Triage Notes (Signed)
Pt is currently [redacted] weeks pregnant, she presents with vaginal discharge x 2-3 days.

## 2022-01-17 LAB — CERVICOVAGINAL ANCILLARY ONLY
Bacterial Vaginitis (gardnerella): NEGATIVE
Candida Glabrata: NEGATIVE
Candida Vaginitis: POSITIVE — AB
Chlamydia: NEGATIVE
Comment: NEGATIVE
Comment: NEGATIVE
Comment: NEGATIVE
Comment: NEGATIVE
Comment: NEGATIVE
Comment: NORMAL
Neisseria Gonorrhea: NEGATIVE
Trichomonas: NEGATIVE

## 2022-01-17 NOTE — ED Provider Notes (Signed)
[facility]    CSN: [id number] Arrival date & time: 01/16/22  1506    HISTORY   Chief Complaint  Patient presents with   Vaginitis   HPI Julie Cherry is a 21 y.o. female. Pt is currently [redacted] weeks pregnant, she presents with vaginal discharge x 2-3 days.  Patient states she has not reach out to her obstetrician about this issue.  Last prenatal visit was 4 days ago.  Patient states she has not tried any home remedies at this time.  Patient denies burning with urination, increased frequency of urination or urgency.  The history is provided by the patient.  Past Medical History:  Diagnosis Date   Asthma    Last had symptoms age 67.    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 12/29/2021   Supervision of other normal pregnancy, antepartum 11/24/2021   Congenital toe deformity- third left toe 02/16/2020   Left foot pain 02/16/2020   At risk for sexually transmitted disease due to unprotected sex 08/06/2019   Breakthrough bleeding on Nexplanon 07/19/2017   Vitamin D deficiency 01/20/2016   Heart murmur 12/02/2013   Vaccination not carried out because of caregiver refusal 09/10/2013   Allergic rhinitis 09/10/2013   Past Surgical History:  Procedure Laterality Date   MOUTH SURGERY     TOOTH EXTRACTION  Aug 2014   severe decay   OB History     Gravida  1   Para      Term      Preterm      AB      Living         SAB      IAB      Ectopic      Multiple      Live Births             Home Medications    Prior to Admission medications   Medication Sig Start Date End Date Taking? Authorizing Provider  terconazole (TERAZOL 7) 0.4 % vaginal cream Apply twice daily to vulvovaginal area, can reapply after every void, use for 7 days as needed 01/16/22  Yes Lynden Oxford Scales, PA-C  Blood  Pressure Monitoring (BLOOD PRESSURE KIT) DEVI 1 Device by Does not apply route as needed. 11/24/21   Woodroe Mode, MD  famotidine (PEPCID) 20 MG tablet Take 1 tablet (20 mg total) by mouth 2 (two) times daily. 11/06/21 11/06/22  Rasch, Anderson Malta I, NP  Prenatal Vit-Fe Fumarate-FA (MULTIVITAMIN-PRENATAL) 27-0.8 MG TABS tablet Take 1 tablet by mouth daily at 12 noon.    [provider]   Family History Family History  Problem Relation Age of Onset   Asthma Father    Diabetes Paternal Grandfather    Healthy Mother    Social History Social History   Tobacco Use   Smoking status: Never   Smokeless tobacco: Never  Vaping Use   Vaping Use: Never used  Substance Use Topics   Alcohol use: No   Drug use: No   Allergies   Pork-derived products  Review of Systems Review of Systems Pertinent findings noted in history of present illness.   Physical Exam Triage Vital Signs ED Triage Vitals  Enc Vitals Group     BP 09/30/21 0827 (!) 147/82     Pulse Rate 09/30/21 0827 72     Resp 09/30/21  0827 18     Temp 09/30/21 0827 98.3 F (36.8 C)     Temp Source 09/30/21 0827 Oral     SpO2 09/30/21 0827 98 %     Weight --      Height --      Head Circumference --      Peak Flow --      Pain Score 09/30/21 0826 5     Pain Loc --      Pain Edu? --      Excl. in [place]? --   No data found.  Updated Vital Signs BP 131/74 (BP Location: Left Arm)    Pulse 87    Temp 98.8 F (37.1 C) (Oral)    Resp 16    LMP 08/28/2021 (Approximate)    SpO2 98%   Physical Exam Vitals and nursing note reviewed.  Constitutional:      General: She is not in acute distress.    Appearance: Normal appearance. She is not ill-appearing.  HENT:     Head: Normocephalic and atraumatic.  Eyes:     General: Lids are normal.        Right eye: No discharge.        Left eye: No discharge.     Extraocular Movements: Extraocular movements intact.     Conjunctiva/sclera: Conjunctivae normal.     Right eye: Right  conjunctiva is not injected.     Left eye: Left conjunctiva is not injected.  Neck:     Trachea: Trachea and phonation normal.  Cardiovascular:     Rate and Rhythm: Normal rate and regular rhythm.     Pulses: Normal pulses.     Heart sounds: Normal heart sounds. No murmur heard.   No friction rub. No gallop.  Pulmonary:     Effort: Pulmonary effort is normal. No accessory muscle usage, prolonged expiration or respiratory distress.     Breath sounds: Normal breath sounds. No stridor, decreased air movement or transmitted upper airway sounds. No decreased breath sounds, wheezing, rhonchi or rales.  Chest:     Chest wall: No tenderness.  Genitourinary:    Comments: Patient politely declines pelvic exam today, patient provided a vaginal swab for testing. Musculoskeletal:        General: Normal range of motion.     Cervical back: Normal range of motion and neck supple. Normal range of motion.  Lymphadenopathy:     Cervical: No cervical adenopathy.  Skin:    General: Skin is warm and dry.     Findings: No erythema or rash.  Neurological:     General: No focal deficit present.     Mental Status: She is alert and oriented to person, place, and time.  Psychiatric:        Mood and Affect: Mood normal.        Behavior: Behavior normal.    Visual Acuity Right Eye Distance:   Left Eye Distance:   Bilateral Distance:    Right Eye Near:   Left Eye Near:    Bilateral Near:     UC Couse / Diagnostics / Procedures:    EKG  Radiology No results found.  Procedures Procedures (including critical care time)  UC Diagnoses / Final Clinical Impressions(s)   I have reviewed the triage vital signs and the nursing notes.  Pertinent labs & imaging results that were available during my care of the patient were reviewed by me and considered in my medical decision making (see chart for details).  Final diagnoses:  Vaginitis and vulvovaginitis   STD screening was performed, patient  advised that the results be posted to their MyChart.  Patient provided with terconazole cream to apply externally to the results of STD screening are complete.  Patient advised to follow-up with OB regarding results to ensure proper.  Urine dip today showed trace leukocytes, patient asymptomatic at this time.  Patient advised to follow-up with OB if she begins to have any burning with urination, increased frequency of urination or urgency.   Return precautions advised.  Drug allergies reviewed, all questions addressed.     ED Prescriptions     Medication Sig Dispense Auth. Provider   terconazole (TERAZOL 7) 0.4 % vaginal cream Apply twice daily to vulvovaginal area, can reapply after every void, use for 7 days as needed 45 g Lynden Oxford Scales, PA-C      PDMP not reviewed this encounter.  Pending results:  Labs Reviewed  POCT URINALYSIS DIPSTICK, ED / UC - Abnormal; Notable for the following components:      Result Value   Ketones, ur TRACE (*)    Hgb urine dipstick TRACE (*)    Leukocytes,Ua SMALL (*)    All other components within normal limits  CERVICOVAGINAL ANCILLARY ONLY    Medications Ordered in UC: Medications - No data to display  Disposition Upon Discharge:  Condition: stable for discharge home  Patient presented with concern for an acute illness with associated systemic symptoms and significant discomfort requiring urgent management. In my opinion, this is a condition that a prudent lay person (someone who possesses an average knowledge of health and medicine) may potentially expect to result in complications if not addressed urgently such as respiratory distress, impairment of bodily function or dysfunction of bodily organs.   As such, the patient has been evaluated and assessed, work-up was performed and treatment was provided in alignment with urgent care protocols and evidence based medicine.  Patient/parent/caregiver has been advised that the patient may require  follow up for further testing and/or treatment if the symptoms continue in spite of treatment, as clinically indicated and appropriate.  Routine symptom specific, illness specific and/or disease specific instructions were discussed with the patient and/or caregiver at length.  Prevention strategies for avoiding STD exposure were also discussed.  The patient will follow up with their current PCP if and as advised. If the patient does not currently have a PCP we will assist them in obtaining one.   The patient may need specialty follow up if the symptoms continue, in spite of conservative treatment and management, for further workup, evaluation, consultation and treatment as clinically indicated and appropriate.  Patient/parent/caregiver verbalized understanding and agreement of plan as discussed.  All questions were addressed during visit.  Please see discharge instructions below for further details of plan.  Discharge Instructions:   Discharge Instructions      The results of your STD testing today will be made available to you once they are complete, this typically takes 3 to 5 days.  They will initially be posted to your MyChart and, if any of your results are abnormal, you will receive a phone call with those results along with further instructions regarding treatment and any prescriptions, if needed.   I provided you with a prescription for terconazole cream that you can apply externally to any vaginal areas that are itching or irritated.  Please be sure you reach out to your obstetrician about your symptoms, particularly if they are are any positive findings  and medications are prescribed.  Is important that your obstetrician knows everything that is going on with you during her pregnancy.   Thank you for visiting urgent care today.  I appreciate the opportunity to participate in your care.     This office note has been dictated using Museum/gallery curator.  Unfortunately,  and despite my best efforts, this method of dictation can sometimes lead to occasional typographical or grammatical errors.  I apologize in advance if this occurs.      Lynden Oxford Scales, [place] 01/17/22 [phone]

## 2022-01-18 ENCOUNTER — Telehealth: Payer: Self-pay

## 2022-01-18 MED ORDER — TERCONAZOLE 0.8 % VA CREA: 1.0000 | TOPICAL_CREAM | Freq: Every day | VAGINAL | 0 refills | Status: DC

## 2022-01-18 NOTE — Telephone Encounter (Signed)
S/w patient and advised that pharmacy stated insurance will only cover terazol 3, not terazol 7 prescription. Sent with provider approval.

## 2022-01-19 ENCOUNTER — Ambulatory Visit: Payer: Self-pay | Admitting: Genetics

## 2022-01-19 ENCOUNTER — Ambulatory Visit: Payer: Medicaid Other | Attending: Obstetrics

## 2022-01-19 ENCOUNTER — Other Ambulatory Visit: Payer: Self-pay

## 2022-01-19 DIAGNOSIS — D563 Thalassemia minor: Secondary | ICD-10-CM

## 2022-01-19 NOTE — Progress Notes (Signed)
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   EDC: 06/04/2022 LMP: 08/28/2021 Referring Provider:  Shelly Bombard, MD  EGA: [redacted]w[redacted]d Genetic Counselor: Staci Righter, MS, CGC  OB Hx: G1P0 Date of Appointment: 01/19/2022  Accompanied by: Kristine Linea Face to Face Time: 30 Minutes   Previous Testing Completed: Julie Cherry previously completed Non-Invasive Prenatal Screening (NIPS) in this pregnancy. The result is low risk. This screening significantly reduces the risk that the current pregnancy has Down syndrome, Trisomy 58, Trisomy 61, and common sex chromosome conditions, however, the risk is not zero given the limitations of NIPS. Additionally, there are many genetic conditions that cannot be detected by NIPS.  Julie Cherry previously completed carrier screening. She screened to be a silent carrier for alpha thalassemia. She screened to not be a carrier for Cystic Fibrosis (CF), Spinal Muscular Atrophy (SMA), and beta hemoglobinopathies. A negative result on carrier screening reduces the likelihood of being a carrier, however, does not entirely rule out the possibility. Julie Cherry previously completed a maternal serum AFP screen in this pregnancy. The result is screen negative. A negative result reduces the risk that the current pregnancy has an open neural tube defect. Closed neural tube defects and some open defects may not be detected by this screen.    Medical History:  Reports she takes prenatal vitamins and famotidine. Reports syphilis infection in pregnancy. Reports she completed the full treatment for this.  Denies personal history of diabetes, high blood pressure, thyroid conditions, and seizures. Denies bleeding and fevers in this pregnancy. Denies using tobacco, alcohol, or street drugs in this pregnancy.   Family History: A pedigree was created and scanned into Epic under the Media tab. Maternal ethnicity reported as African  American and paternal ethnicity reported asAfrican American. Denies Ashkenazi Jewish ancestry. Family history not remarkable for consanguinity, individuals with birth defects, intellectual disability, autism spectrum disorder, multiple spontaneous abortions, still births, or unexplained neonatal death.     Genetic Counseling:   Silent Carrier for Alpha Thalassemia. Julie Cherry is a silent carrier for alpha thalassemia (??/?-). Positive for the pathogenic alpha 3.7 deletion of the HBA2 gene. With Julie Cherry's alpha thalassemia screening result, we know that she has three working copies of the alpha-globin genes while the 4th alpha-globin gene is deleted. Each of Julie Cherry's children will either inherit two functional copies or one functional copy with one deletion from her. Julie Cherry is not at an increased risk to have a baby with fetal hydrops due to Hemoglobin Barts disease (--/--) regardless of her reproductive partner's carrier status. We discussed there would be a 25% risk for the current pregnancy to be affected with Hemoglobin H disease (--/?-) if her reproductive partner is found to be an alpha thalassemia carrier in the cis configuration (??/--). Clinical features of Hemoglobin H disease are highly variable and generally develop in the first years of life. The primary symptoms include moderate anemia with marked microcytosis, jaundice, and hepatosplenomegaly. Some affected individuals do not require blood transfusions while others may require occasional blood transfusions throughout their lifetime. Because Julie Cherry is a silent carrier for alpha thalassemia (??/?-), carrier screening for her reproductive partner is recommended to determine risk for the current pregnancy. If her partner is found to be a carrier for alpha thalassemia, given his African American ancestry, it is more likely for him to be a silent carrier (??/?-) or a carrier in the trans configuration (?-/?-) as the cis configuration (??/--) has been  reported very rarely in individuals  with African American ancestry. If her reproductive partner is a non-carrier (??/??), a silent carrier (??/?-), or a carrier in the trans configuration (?-/?-) there would not be an increased risk for the pregnancy to have Hemoglobin H disease. Julie Cherry stated she is going to speak with her reproductive partner to see if he is interested in completing alpha thalassemia carrier screening. Genetic counseling requested that Julie [facility] call the [facility] as soon as possible in the event her partner would like to pursue this screening.     Patient Plan:  Proceed with: Routine prenatal care Informed consent was obtained. All questions were answered.  Declined: Carrier Screening for Julie Cherry' reproductive partner   Thank you for sharing in the care of Julie Cherry with [place].  Please do not hesitate to contact us if you have any questions.  Staci Righter, MS, [facility]

## 2022-01-20 DIAGNOSIS — D563 Thalassemia minor: Secondary | ICD-10-CM | POA: Insufficient documentation

## 2022-01-27 ENCOUNTER — Encounter: Payer: Self-pay | Admitting: Obstetrics and Gynecology

## 2022-01-27 ENCOUNTER — Other Ambulatory Visit: Payer: Self-pay

## 2022-01-27 ENCOUNTER — Ambulatory Visit (INDEPENDENT_AMBULATORY_CARE_PROVIDER_SITE_OTHER): Payer: Medicaid Other | Admitting: Obstetrics and Gynecology

## 2022-01-27 DIAGNOSIS — R87613 High grade squamous intraepithelial lesion on cytologic smear of cervix (HGSIL): Secondary | ICD-10-CM

## 2022-01-27 DIAGNOSIS — R87619 Unspecified abnormal cytological findings in specimens from cervix uteri: Secondary | ICD-10-CM | POA: Insufficient documentation

## 2022-01-27 NOTE — Patient Instructions (Signed)
Colposcopy, Care After The following information offers guidance on how to care for yourself after your procedure. Your doctor may also give you more specific instructions. If you have problems or questions, contact your doctor. What can I expect after the procedure? If you did not have a sample of your tissue taken out (did not have a biopsy), you may only have some spotting of blood for a few days. You can go back to your normal activities. If you had a sample of your tissue taken out, it is common to have: Soreness and mild pain. These may last for a few days. Mild bleeding or fluid (discharge) coming from your vagina. The fluid will look dark and grainy. You may have this for a few days. The fluid may be caused by a liquid that was used during your procedure. You may need to wear a sanitary pad. Spotting of blood for at least 48 hours after the procedure. Follow these instructions at home: Medicines Take over-the-counter and prescription medicines only as told by your doctor. Ask your doctor what over-the-counter pain medicines and prescription medicines you can start taking again. This is very important if you take blood thinners. Activity For at least 3 days, or for as long as told by your doctor, avoid: Douching. Using tampons. Having sex. Return to your normal activities as told by your doctor. Ask your doctor what activities are safe for you. General instructions Ask your doctor if you may take baths, swim, or use a hot tub. You may take showers. If you use birth control (contraception), keep using it. Keep all follow-up visits. Contact a doctor if: You have a fever or chills. You faint or feel light-headed. Get help right away if: You bleed a lot from your vagina. A lot of bleeding means that the bleeding soaks through a pad in less than 1 hour. You have clumps of blood (blood clots) coming from your vagina. You have signs that could mean you have an infection. This may be fluid  coming from your vagina that is: Different than normal. Yellow. Bad-smelling. You have very bad pain or cramps in your lower belly that do not get better with medicine. Summary If you did not have a sample of your tissue taken out, you may only have some spotting of blood for a few days. You can go back to your normal activities. If you had a sample of your tissue taken out, it is common to have mild pain for a few days and spotting for 48 hours. Avoid douching, using tampons, and having sex for at least 3 days after the procedure or for as long as told. Get help right away if you have a lot of bleeding, very bad pain, or signs of infection. This information is not intended to replace advice given to you by your health care provider. Make sure you discuss any questions you have with your health care provider. Document Revised: 04/17/2021 Document Reviewed: 04/17/2021 Elsevier Patient Education  2022 [place]. Colposcopy Colposcopy is a procedure to examine the lowest part of the uterus (cervix) for abnormalities or signs of disease. This procedure is done using an instrument that makes objects appear larger and provides light (colposcope). During the procedure, your health care provider may remove a tissue sample to look at under a microscope (biopsy). A biopsy may be done if any unusual cells are seen during the colposcopy. You may have a colposcopy if you have: An abnormal Pap smear, also called a Pap  test. This screening test is used to check for signs of cancer or infection of the vagina, cervix, and uterus. An HPV (human papillomavirus) test and get a positive result for a type of HPV that puts you at high risk of cancer. Certain conditions or symptoms, such as: A sore, or lesion, on your cervix. Genital warts on your vulva, vagina, or cervix. Pain during sex. Vaginal bleeding, especially after sex. A growth on your cervix (cervical polyp) that needs to be removed. Let your health  care provider know about: Any allergies you have, including allergies to medicines, latex, or iodine. All medicines you are taking, including vitamins, herbs, eye drops, creams, and over-the-counter medicines. Any bleeding problems you have. Any surgeries you have had. Any medical conditions you have, such as pelvic inflammatory disease (PID) or an endometrial disorder. The pattern of your menstrual cycles and the form of birth control (contraception) you use, if any. Your medical history, including any cervical treatments and how well you tolerated the procedure (if you have ever fainted). Whether you are pregnant or may be pregnant. What are the risks? Generally, this is a safe procedure. However, problems may occur, including: Infection. Symptoms of infection may include fever, bad-smelling vaginal discharge, or pelvic pain. Vaginal bleeding. Allergic reactions to medicines. Damage to nearby structures or organs. What happens before the procedure? Medicines Ask your health care provider about: Changing or stopping your regular medicines. This is especially important if you are taking diabetes medicines or blood thinners. Taking medicines such as aspirin and ibuprofen. These medicines can thin your blood. Do not take these medicines unless your health care provider tells you to take them. Your health care provider will likely tell you to avoid taking aspirin, or medicine that contains aspirin, for 7 days before the procedure. Taking over-the-counter medicines, vitamins, herbs, and supplements. General instructions Tell your health care provider if you have your menstrual period now or will have it at the time of your procedure. A colposcopy is not normally done during your menstrual period. If you use contraception, continue to use it before your procedure. For 24 hours before the procedure: Do not use douche products or tampons. Do not use medicines, creams, or suppositories in the  vagina. Do not have sex or insert anything into your vagina. Ask your health care provider what steps will be taken to prevent infection. What happens during the procedure? You will lie down on your back, with your feet in foot rests (stirrups). An instrument called a speculum will be inserted into your vagina. This will be used so your health care provider can see your cervix and the inside of your vagina. A cotton swab will be used to place a small amount of a liquid (solution) on the areas to be examined. This solution makes it easier to see abnormal cells. You may feel a slight burning during this part. The colposcope will be used to scan the cervix with a bright white light. The colposcope will be held near your vulva and will make your vulva, vagina, and cervix look bigger so they can be seen better. If a biopsy is needed: You may be given a medicine to numb the area (local anesthetic). Surgical tools will be used to remove mucus and cells through your vagina. You may feel mild pain while the tissue sample is removed. Bleeding may occur. A solution may be used to stop the bleeding. The tissue removed will be sent to a lab to be looked at under  a microscope. The procedure may vary among health care providers and hospitals. What happens after the procedure? You may have some cramping in your abdomen. This should go away after a few minutes. It is up to you to get the results of your procedure. Ask your health care provider, or the department that is doing the procedure, when your results will be ready. Summary Colposcopy is a procedure to examine the lowest part of the uterus (cervix), for signs of disease. A biopsy may be done as part of the procedure. You may have some cramping in your abdomen. This should go away after a few minutes. It is up to you to get the results of your procedure. Ask your health care provider, or the department that is doing the procedure, when your results will  be ready. This information is not intended to replace advice given to you by your health care provider. Make sure you discuss any questions you have with your health care provider. Document Revised: 04/17/2021 Document Reviewed: 04/17/2021 Elsevier Patient Education  [place].

## 2022-01-27 NOTE — Progress Notes (Signed)
° ° °  GYNECOLOGY CLINIC COLPOSCOPY PROCEDURE NOTE  21 y.o. G1P0 here for colposcopy for high-grade squamous intraepithelial neoplasia  (HGSIL-encompassing moderate and severe dysplasia) pap smear on 1/23. Discussed role for HPV in cervical dysplasia, need for surveillance.  Patient given informed consent, signed copy in the chart, time out was performed.  Placed in lithotomy position. Cervix viewed with speculum and colposcope after application of acetic acid.   Colposcopy adequate? Yes  no visible lesions; NO Bx or ECC due to no visible lesions and IUP   Continue with routine OB care. Repeat pap smear PP  Nettie Elm, MD, FACOG Attending Obstetrician & Gynecologist Center for [facility], [facility]

## 2022-02-03 ENCOUNTER — Inpatient Hospital Stay (HOSPITAL_BASED_OUTPATIENT_CLINIC_OR_DEPARTMENT_OTHER): Payer: Medicaid Other

## 2022-02-03 ENCOUNTER — Encounter (HOSPITAL_COMMUNITY): Payer: Self-pay | Admitting: Obstetrics and Gynecology

## 2022-02-03 ENCOUNTER — Inpatient Hospital Stay (HOSPITAL_COMMUNITY)
Admission: AD | Admit: 2022-02-03 | Discharge: 2022-02-03 | Disposition: A | Discharge status: Home / Self Care | Payer: Medicaid Other | Attending: Obstetrics and Gynecology | Admitting: Obstetrics and Gynecology

## 2022-02-03 ENCOUNTER — Other Ambulatory Visit: Payer: Self-pay

## 2022-02-03 DIAGNOSIS — Z6720 Type B blood, Rh positive: Secondary | ICD-10-CM | POA: Diagnosis not present

## 2022-02-03 DIAGNOSIS — R109 Unspecified abdominal pain: Secondary | ICD-10-CM | POA: Diagnosis not present

## 2022-02-03 DIAGNOSIS — S3991XA Unspecified injury of abdomen, initial encounter: Secondary | ICD-10-CM | POA: Diagnosis not present

## 2022-02-03 DIAGNOSIS — Z3492 Encounter for supervision of normal pregnancy, unspecified, second trimester: Secondary | ICD-10-CM

## 2022-02-03 DIAGNOSIS — Z3689 Encounter for other specified antenatal screening: Secondary | ICD-10-CM | POA: Insufficient documentation

## 2022-02-03 DIAGNOSIS — O9A312 Physical abuse complicating pregnancy, second trimester: Secondary | ICD-10-CM | POA: Insufficient documentation

## 2022-02-03 DIAGNOSIS — Z3A22 22 weeks gestation of pregnancy: Secondary | ICD-10-CM

## 2022-02-03 DIAGNOSIS — O26892 Other specified pregnancy related conditions, second trimester: Secondary | ICD-10-CM | POA: Diagnosis not present

## 2022-02-03 LAB — URINALYSIS, ROUTINE W REFLEX MICROSCOPIC
Bilirubin Urine: NEGATIVE
Glucose, UA: NEGATIVE mg/dL
Hgb urine dipstick: NEGATIVE
Ketones, ur: NEGATIVE mg/dL
Leukocytes,Ua: NEGATIVE
Nitrite: NEGATIVE
Protein, ur: NEGATIVE mg/dL
Specific Gravity, Urine: 1.016 (ref 1.005–1.030)
pH: 8 (ref 5.0–8.0)
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
## 2022-02-03 NOTE — Discharge Instructions (Signed)
Return to MAU if cramping gets worse or any vaginal bleeding is noted. ?
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
## 2022-02-06 ENCOUNTER — Encounter: Payer: Self-pay | Admitting: Obstetrics

## 2022-02-09 ENCOUNTER — Ambulatory Visit (INDEPENDENT_AMBULATORY_CARE_PROVIDER_SITE_OTHER): Payer: Medicaid Other | Admitting: Family Medicine

## 2022-02-09 ENCOUNTER — Other Ambulatory Visit: Payer: Self-pay

## 2022-02-09 ENCOUNTER — Encounter: Payer: Self-pay | Admitting: Family Medicine

## 2022-02-09 VITALS — BP 131/71 | HR 98 | Wt 154.4 lb

## 2022-02-09 DIAGNOSIS — O98112 Syphilis complicating pregnancy, second trimester: Secondary | ICD-10-CM

## 2022-02-09 DIAGNOSIS — Z3480 Encounter for supervision of other normal pregnancy, unspecified trimester: Secondary | ICD-10-CM

## 2022-02-09 DIAGNOSIS — R0781 Pleurodynia: Secondary | ICD-10-CM

## 2022-02-09 DIAGNOSIS — Z3A23 23 weeks gestation of pregnancy: Secondary | ICD-10-CM

## 2022-02-09 NOTE — Progress Notes (Signed)
Pt presents for ROB reports no complaints today.  

## 2022-02-09 NOTE — Progress Notes (Signed)
? ? ?  Subjective:  ?Julie Cherry is a 21 y.o. G1P0 at [redacted]w[redacted]d being seen today for ongoing prenatal care.  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 ? ?Patient reports  right rib pain when she sits for long periods of time. When she lays down or gets up, it goes away shortly after. Eating and drinking normally .  Contractions: Not present. Vag. Bleeding: None.  Movement: Present. Denies leaking of fluid.  ? ?The following portions of the patient's history were reviewed and updated as appropriate: allergies, current medications, past family history, past medical history, past social history, past surgical history and problem list.  ? ?Objective:  ? ?Vitals:  ? 02/09/22 1609  ?BP: 131/71  ?Pulse: 98  ?Weight: 154 lb 6.4 oz (70 kg)  ? ? ?Fetal Status: Fetal Heart Rate (bpm): 145 Fundal Height: 23 cm Movement: Present    ? ?General:  Alert, oriented and cooperative. Patient is in no acute distress.  ?Skin: Skin is warm and dry. No rash noted.   ?Cardiovascular: Normal heart rate noted  ?Respiratory: Normal respiratory effort, no problems with respiration noted  ?Abdomen: Soft, gravid, appropriate for gestational age. Pain/Pressure: Absent     ?Pelvic:  Cervical exam deferred        ?Extremities: Normal range of motion.  Edema: None  ?Mental Status: Normal mood and affect. Normal behavior. Normal judgment and thought content.  ? ? ?Assessment and Plan:  ?Pregnancy: G1P0 at [redacted]w[redacted]d ? ?1. Supervision of other normal pregnancy, antepartum ?Doing well.  ? ?2. [redacted] weeks gestation of pregnancy ?Has appt scheduled in 03/2022 for monitoring fetal growth.  ? ?3. Syphilis affecting pregnancy in second  trimester ?Reactive 1:16 in 12/2021, s/p 3 weeks of PCN treatment through [facility]. Can continue RPR screening during pregnancy, but should definitely have one at 6 months post for definitive evaluation of treatment success.  ? ?4. Rib pain on right side ?Suspect related to growing uterus/fetus as only occurring when it is pushing against her ribs. No concern for liver/gallbladder given description. BP WNL. Encouraged avoiding prolonged sitting/improved posture as she can. Monitor.  ? ?Preterm labor symptoms and general obstetric precautions including but not limited to vaginal bleeding, contractions, leaking of fluid and fetal movement were reviewed in detail with the patient. ?Please refer to After Visit Summary for other counseling recommendations.  ? ?Return in about 4 weeks (around 03/09/2022) for ROB. ? ? ?Allayne Stack, DO ?

## 2022-03-06 ENCOUNTER — Ambulatory Visit: Payer: Medicaid Other | Attending: Obstetrics and Gynecology

## 2022-03-06 ENCOUNTER — Ambulatory Visit: Payer: Medicaid Other | Admitting: *Deleted

## 2022-03-06 ENCOUNTER — Other Ambulatory Visit: Payer: Self-pay | Admitting: *Deleted

## 2022-03-06 VITALS — BP 119/61 | HR 82

## 2022-03-06 DIAGNOSIS — O98112 Syphilis complicating pregnancy, second trimester: Secondary | ICD-10-CM | POA: Diagnosis not present

## 2022-03-06 DIAGNOSIS — Z3480 Encounter for supervision of other normal pregnancy, unspecified trimester: Secondary | ICD-10-CM | POA: Insufficient documentation

## 2022-03-06 DIAGNOSIS — O358XX0 Maternal care for other (suspected) fetal abnormality and damage, not applicable or unspecified: Secondary | ICD-10-CM | POA: Diagnosis not present

## 2022-03-06 DIAGNOSIS — O98119 Syphilis complicating pregnancy, unspecified trimester: Secondary | ICD-10-CM

## 2022-03-06 DIAGNOSIS — Z3A27 27 weeks gestation of pregnancy: Secondary | ICD-10-CM | POA: Diagnosis not present

## 2022-03-06 DIAGNOSIS — A530 Latent syphilis, unspecified as early or late: Secondary | ICD-10-CM

## 2022-03-06 DIAGNOSIS — O26892 Other specified pregnancy related conditions, second trimester: Secondary | ICD-10-CM | POA: Insufficient documentation

## 2022-03-06 DIAGNOSIS — Z362 Encounter for other antenatal screening follow-up: Secondary | ICD-10-CM | POA: Diagnosis not present

## 2022-03-06 DIAGNOSIS — Z3689 Encounter for other specified antenatal screening: Secondary | ICD-10-CM | POA: Insufficient documentation

## 2022-03-09 ENCOUNTER — Ambulatory Visit (INDEPENDENT_AMBULATORY_CARE_PROVIDER_SITE_OTHER): Payer: Medicaid Other | Admitting: Obstetrics and Gynecology

## 2022-03-09 ENCOUNTER — Encounter: Payer: Self-pay | Admitting: Obstetrics and Gynecology

## 2022-03-09 VITALS — BP 117/72 | HR 108 | Wt 159.0 lb

## 2022-03-09 DIAGNOSIS — Z23 Encounter for immunization: Secondary | ICD-10-CM

## 2022-03-09 DIAGNOSIS — O98112 Syphilis complicating pregnancy, second trimester: Secondary | ICD-10-CM

## 2022-03-09 DIAGNOSIS — Z3480 Encounter for supervision of other normal pregnancy, unspecified trimester: Secondary | ICD-10-CM

## 2022-03-09 NOTE — Progress Notes (Signed)
? ?  PRENATAL VISIT NOTE ? ?Subjective:  ?Julie Cherry is a 21 y.o. G1P0 at [redacted]w[redacted]d being seen today for ongoing prenatal care.  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 ? ?Patient reports no complaints.  Contractions: Not present. Vag. Bleeding: None.  Movement: Present. Denies leaking of fluid.  ? ?The following portions of the patient's history were reviewed and updated as appropriate: allergies, current medications, past family history, past medical history, past social history, past surgical history and problem list.  ? ?Objective:  ? ?Vitals:  ? 03/09/22 1432  ?BP: 117/72  ?Pulse: (!) 108  ?Weight: 159 lb (72.1 kg)  ? ? ?Fetal Status: Fetal Heart Rate (bpm): 154 Fundal Height: 28 cm Movement: Present    ? ?General:  Alert, oriented and cooperative. Patient is in no acute distress.  ?Skin: Skin is warm and dry. No rash noted.   ?Cardiovascular: Normal heart rate noted  ?Respiratory: Normal respiratory effort, no problems with respiration noted  ?Abdomen: Soft, gravid, appropriate for gestational age.  Pain/Pressure: Absent     ?Pelvic: Cervical exam deferred        ?Extremities: Normal range of motion.  Edema: None  ?Mental Status: Normal mood and affect. Normal behavior. Normal judgment and thought content.  ? ?Assessment and Plan:  ?Pregnancy: G1P0 at [redacted]w[redacted]d ?1. Supervision of other normal pregnancy, antepartum ?Patient is doing well without complaints ?Patient will return prior to next appointment for glucola ?Patient is planning a trip to [place] in 2 weeks ?- Tdap vaccine greater than or equal to 7yo IM ? ?2. 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 ?Preterm  labor symptoms and general obstetric precautions including but not limited to vaginal bleeding, contractions, leaking of fluid and fetal movement were reviewed in detail with the patient. ?Please refer to After Visit Summary for other counseling recommendations.  ? ?Return in about 2 weeks (around 03/23/2022) for in person, ROB. ? ?Future Appointments  ?Date Time Provider [place]  ?03/13/2022  9:20 AM [facility] None  ?04/03/2022  2:30 PM Nicole Defino, Vickii Chafe, MD [facility] None  ?04/10/2022  2:30 PM [facility]  ?04/10/2022  2:45 PM [facility] US5 [facility]  ? ? ?Mora Bellman, MD ? ?

## 2022-03-09 NOTE — Progress Notes (Signed)
ROB, TDAP given in LD, tolerated well. °

## 2022-03-13 ENCOUNTER — Other Ambulatory Visit: Payer: Medicaid Other

## 2022-03-13 DIAGNOSIS — Z3480 Encounter for supervision of other normal pregnancy, unspecified trimester: Secondary | ICD-10-CM | POA: Diagnosis not present

## 2022-03-15 LAB — CBC
Hematocrit: 31.3 % — ABNORMAL LOW (ref 34.0–46.6)
Hemoglobin: 10.3 g/dL — ABNORMAL LOW (ref 11.1–15.9)
MCH: 27 pg (ref 26.6–33.0)
MCHC: 32.9 g/dL (ref 31.5–35.7)
MCV: 82 fL (ref 79–97)
Platelets: 261 10*3/uL (ref 150–450)
RBC: 3.81 x10E6/uL (ref 3.77–5.28)
RDW: 12.6 % (ref 11.7–15.4)
WBC: 13.3 10*3/uL — ABNORMAL HIGH (ref 3.4–10.8)

## 2022-03-15 LAB — RPR, QUANT+TP ABS (REFLEX)
Rapid Plasma Reagin, Quant: 1:8 {titer} — ABNORMAL HIGH
T Pallidum Abs: REACTIVE — AB

## 2022-03-15 LAB — GLUCOSE TOLERANCE, 2 HOURS W/ 1HR
Glucose, 1 hour: 88 mg/dL (ref 70–179)
Glucose, 2 hour: 82 mg/dL (ref 70–152)
Glucose, Fasting: 68 mg/dL — ABNORMAL LOW (ref 70–91)

## 2022-03-15 LAB — HIV ANTIBODY (ROUTINE TESTING W REFLEX): HIV Screen 4th Generation wRfx: NONREACTIVE

## 2022-03-15 LAB — RPR: RPR Ser Ql: REACTIVE — AB

## 2022-03-30 ENCOUNTER — Encounter: Payer: Medicaid Other | Admitting: Obstetrics and Gynecology

## 2022-04-03 ENCOUNTER — Encounter: Payer: Self-pay | Admitting: Obstetrics and Gynecology

## 2022-04-03 ENCOUNTER — Ambulatory Visit (INDEPENDENT_AMBULATORY_CARE_PROVIDER_SITE_OTHER): Payer: Medicaid Other | Admitting: Obstetrics and Gynecology

## 2022-04-03 VITALS — BP 109/70 | HR 108 | Wt 170.2 lb

## 2022-04-03 DIAGNOSIS — O98112 Syphilis complicating pregnancy, second trimester: Secondary | ICD-10-CM

## 2022-04-03 DIAGNOSIS — Z3480 Encounter for supervision of other normal pregnancy, unspecified trimester: Secondary | ICD-10-CM

## 2022-04-03 NOTE — Progress Notes (Signed)
Pt reports fetal movement, denies pain.  

## 2022-04-03 NOTE — Progress Notes (Signed)
? ?  PRENATAL VISIT NOTE ? ?Subjective:  ?Julie Cherry is a 21 y.o. G1P0 at [redacted]w[redacted]d being seen today for ongoing prenatal care.  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 ? ?Patient reports no complaints.  Contractions: Not present.  .  Movement: Present. Denies leaking of fluid.  ? ?The following portions of the patient's history were reviewed and updated as appropriate: allergies, current medications, past family history, past medical history, past social history, past surgical history and problem list.  ? ?Objective:  ? ?Vitals:  ? 04/03/22 1428  ?BP: 109/70  ?Pulse: (!) 108  ?Weight: 170 lb 3.2 oz (77.2 kg)  ? ? ?Fetal Status: Fetal Heart Rate (bpm): 148 Fundal Height: 31 cm Movement: Present    ? ?General:  Alert, oriented and cooperative. Patient is in no acute distress.  ?Skin: Skin is warm and dry. No rash noted.   ?Cardiovascular: Normal heart rate noted  ?Respiratory: Normal respiratory effort, no problems with respiration noted  ?Abdomen: Soft, gravid, appropriate for gestational age.  Pain/Pressure: Absent     ?Pelvic: Cervical exam deferred        ?Extremities: Normal range of motion.  Edema: Trace  ?Mental Status: Normal mood and affect. Normal behavior. Normal judgment and thought content.  ? ?Assessment and Plan:  ?Pregnancy: G1P0 at [redacted]w[redacted]d ?1. Supervision of other normal pregnancy, antepartum ?Patient is doing well without complaints ?Still researching pediatrician ?Nexplanon for contraception ?Follow up growth ultrasound next week ? ?2. 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 ?Preterm labor symptoms and general obstetric precautions  including but not limited to vaginal bleeding, contractions, leaking of fluid and fetal movement were reviewed in detail with the patient. ?Please refer to After Visit Summary for other counseling recommendations.  ? ?Return in about 2 weeks (around 04/17/2022) for in person, ROB, Low risk. ? ?Future Appointments  ?Date Time Provider Department Center  ?04/10/2022  2:30 PM [facility]  ?04/10/2022  2:45 PM [facility] US5 [facility]  ?04/18/2022  4:10 PM Warner Mccreedy, MD [facility] None  ? ? ?Catalina Antigua, MD ? ?

## 2022-04-04 ENCOUNTER — Inpatient Hospital Stay (HOSPITAL_COMMUNITY)
Admission: AD | Admit: 2022-04-04 | Discharge: 2022-04-04 | Disposition: A | Discharge status: Home / Self Care | Payer: Medicaid Other | Attending: Obstetrics & Gynecology | Admitting: Obstetrics & Gynecology

## 2022-04-04 ENCOUNTER — Inpatient Hospital Stay (HOSPITAL_COMMUNITY): Payer: Medicaid Other

## 2022-04-04 ENCOUNTER — Other Ambulatory Visit: Payer: Self-pay

## 2022-04-04 ENCOUNTER — Encounter (HOSPITAL_COMMUNITY): Payer: Self-pay | Admitting: Obstetrics & Gynecology

## 2022-04-04 DIAGNOSIS — O98112 Syphilis complicating pregnancy, second trimester: Secondary | ICD-10-CM

## 2022-04-04 DIAGNOSIS — Z3480 Encounter for supervision of other normal pregnancy, unspecified trimester: Secondary | ICD-10-CM

## 2022-04-04 DIAGNOSIS — O26893 Other specified pregnancy related conditions, third trimester: Secondary | ICD-10-CM | POA: Diagnosis not present

## 2022-04-04 DIAGNOSIS — R1011 Right upper quadrant pain: Secondary | ICD-10-CM | POA: Diagnosis not present

## 2022-04-04 DIAGNOSIS — R110 Nausea: Secondary | ICD-10-CM | POA: Diagnosis not present

## 2022-04-04 DIAGNOSIS — Z3689 Encounter for other specified antenatal screening: Secondary | ICD-10-CM

## 2022-04-04 DIAGNOSIS — Z3A31 31 weeks gestation of pregnancy: Secondary | ICD-10-CM | POA: Diagnosis not present

## 2022-04-04 LAB — CBC WITH DIFFERENTIAL/PLATELET
Abs Immature Granulocytes: 0.12 10*3/uL — ABNORMAL HIGH (ref 0.00–0.07)
Basophils Absolute: 0 10*3/uL (ref 0.0–0.1)
Basophils Relative: 0 %
Eosinophils Absolute: 0.1 10*3/uL (ref 0.0–0.5)
Eosinophils Relative: 1 %
HCT: 30.4 % — ABNORMAL LOW (ref 36.0–46.0)
Hemoglobin: 10.1 g/dL — ABNORMAL LOW (ref 12.0–15.0)
Immature Granulocytes: 1 %
Lymphocytes Relative: 14 %
Lymphs Abs: 1.8 10*3/uL (ref 0.7–4.0)
MCH: 27.4 pg (ref 26.0–34.0)
MCHC: 33.2 g/dL (ref 30.0–36.0)
MCV: 82.4 fL (ref 80.0–100.0)
Monocytes Absolute: 1.3 10*3/uL — ABNORMAL HIGH (ref 0.1–1.0)
Monocytes Relative: 10 %
Neutro Abs: 10 10*3/uL — ABNORMAL HIGH (ref 1.7–7.7)
Neutrophils Relative %: 74 %
Platelets: 251 10*3/uL (ref 150–400)
RBC: 3.69 MIL/uL — ABNORMAL LOW (ref 3.87–5.11)
RDW: 12.7 % (ref 11.5–15.5)
WBC: 13.5 10*3/uL — ABNORMAL HIGH (ref 4.0–10.5)
nRBC: 0 % (ref 0.0–0.2)

## 2022-04-04 LAB — URINALYSIS, ROUTINE W REFLEX MICROSCOPIC
Bilirubin Urine: NEGATIVE
Glucose, UA: NEGATIVE mg/dL
Hgb urine dipstick: NEGATIVE
Ketones, ur: 20 mg/dL — AB
Leukocytes,Ua: NEGATIVE
Nitrite: NEGATIVE
Protein, ur: NEGATIVE mg/dL
Specific Gravity, Urine: 1.019 (ref 1.005–1.030)
pH: 6 (ref 5.0–8.0)

## 2022-04-04 LAB — AMYLASE: Amylase: 99 U/L (ref 28–100)

## 2022-04-04 LAB — COMPREHENSIVE METABOLIC PANEL
ALT: 23 U/L (ref 0–44)
AST: 22 U/L (ref 15–41)
Albumin: 2.8 g/dL — ABNORMAL LOW (ref 3.5–5.0)
Alkaline Phosphatase: 84 U/L (ref 38–126)
Anion gap: 7 (ref 5–15)
BUN: 8 mg/dL (ref 6–20)
CO2: 25 mmol/L (ref 22–32)
Calcium: 9 mg/dL (ref 8.9–10.3)
Chloride: 106 mmol/L (ref 98–111)
Creatinine, Ser: 0.55 mg/dL (ref 0.44–1.00)
GFR, Estimated: >60 mL/min (ref >60)
Glucose, Bld: 102 mg/dL — ABNORMAL HIGH (ref 70–99)
Potassium: 3.3 mmol/L — ABNORMAL LOW (ref 3.5–5.1)
Sodium: 138 mmol/L (ref 135–145)
Total Bilirubin: 0.4 mg/dL (ref 0.3–1.2)
Total Protein: 5.8 g/dL — ABNORMAL LOW (ref 6.5–8.1)

## 2022-04-04 LAB — PROTEIN / CREATININE RATIO, URINE
Creatinine, Urine: 113.22 mg/dL
Protein Creatinine Ratio: 0.15 mg/mg{Cre} (ref 0.00–0.15)
Total Protein, Urine: 17 mg/dL

## 2022-04-04 LAB — LIPASE, BLOOD: Lipase: 37 U/L (ref 11–51)

## 2022-04-04 MED ORDER — ACETAMINOPHEN 500 MG PO TABS
1000.0000 mg | ORAL_TABLET | Freq: Once | ORAL | Status: AC
  Administered 2022-04-04: 1000 mg via ORAL
  Filled 2022-04-04: qty 2

## 2022-04-04 MED ORDER — ONDANSETRON 4 MG PO TBDP
8.0000 mg | ORAL_TABLET | Freq: Once | ORAL | Status: AC
  Administered 2022-04-04: 8 mg via ORAL
  Filled 2022-04-04: qty 2

## 2022-04-04 NOTE — MAU Note (Signed)
...  Julie Cherry is a 21 y.o. at [redacted]w[redacted]d here in MAU reporting: Constant RUQ aching pain that is worse with movement and walking. She reports she was in [place] last week for four days and [place] back [place] the [place] this past Saturday, 4[place]29. Denies VB or LOF. +FM. ? ?Onset of complaint: 04/02/2022 ? ?Pain score: 9/10 RUQ ? ?Vitals:  ? 04/04/22 1507  ?BP: 128/62  ?Pulse: (!) 103  ?Resp: 19  ?Temp: 98.4 ?F (36.9 ?C)  ?   ?FHT: unable to doppler FHT, patient wearing a body suit. ?Lab orders placed from triage: UA  ? ?

## 2022-04-04 NOTE — MAU Provider Note (Signed)
?History  ?  ? ?CSN: [id number] ? ?Arrival date and time: 04/04/22 1450 ? ? Event Date/Time  ? First Provider Initiated Contact with Patient 04/04/22 1603   ?  ? ?No chief complaint on file. ? ?Julie Cherry is a 21 y.o. G1P0 at [redacted]w[redacted]d who receives care at [facility].  She presents today for RUQ Pain. She states the pain started Sunday and has been constant.  She reports it has no relieving factors, but is aggravated by certain movements.  She describes the pain as a dull aching sensation.  She rates the pain a 9/10.  She endorses fetal movement and denies abdominal cramping/contractions.  ? ?24 hr Recall: ? ?Breakfast today: ?Pancake, Eggs, Sausage ? ?Dinner: ?Tomasa Hose ? ?Lunch ?Shrimp-Boiled ? ?Breakfast-None ? ? ?OB History   ? ? Gravida  ?1  ? Para  ?   ? Term  ?   ? Preterm  ?   ? AB  ?   ? Living  ?   ?  ? ? SAB  ?   ? IAB  ?   ? Ectopic  ?   ? Multiple  ?   ? Live Births  ?   ?   ?  ?  ? ? ?Past Medical History:  ?Diagnosis Date  ? Asthma   ? Last had symptoms age 44.   ? At risk for sexually transmitted disease due to unprotected sex 08/06/2019  ? Breakthrough bleeding on Nexplanon 07/19/2017  ? Chlamydia 07/09/2019  ? Deliberate self-cutting 09/10/2013  ? Pyelonephritis 05/27/2016  ? Vaccination not carried out because of caregiver refusal 09/10/2013  ? Mom refuses flu and HPV vaccine  ? ? ?Past Surgical History:  ?Procedure Laterality Date  ? MOUTH SURGERY    ? TOOTH EXTRACTION  Aug 2014  ? severe decay  ? ? ?Family History  ?Problem Relation Age of Onset  ? Asthma Father   ? Diabetes Paternal Grandfather   ? Healthy Mother   ? ? ?Social History  ? ?Tobacco Use  ? Smoking status: Never  ? Smokeless tobacco: Never  ?Vaping Use  ? Vaping Use: Never used  ?Substance Use Topics  ? Alcohol use: No  ? Drug use: No  ? ? ?Allergies:  ?Allergies  ?Allergen Reactions  ? Pork-Derived Products   ?  Patient does not eat pork but can have meds/products with pork   ? ? ?Medications Prior to Admission  ?Medication Sig  Dispense Refill Last Dose  ? Prenatal Vit-Fe Fumarate-FA (MULTIVITAMIN-PRENATAL) 27-0.8 MG TABS tablet Take 1 tablet by mouth daily at 12 noon.   04/04/2022  ? Blood Pressure Monitoring (BLOOD PRESSURE KIT) DEVI 1 Device by Does not apply route as needed. 1 each 0   ? famotidine (PEPCID) 20 MG tablet Take 1 tablet (20 mg total) by mouth 2 (two) times daily. (Patient not taking: Reported on 02/09/2022) 60 tablet 1   ? ? ?Review of Systems  ?Constitutional:  Negative for chills and fever.  ?Eyes:  Negative for visual disturbance.  ?Gastrointestinal:  Positive for abdominal pain and nausea (Ongoing since third trimester). Negative for vomiting.  ?Genitourinary:  Negative for difficulty urinating, dysuria, flank pain, vaginal bleeding and vaginal discharge.  ?Neurological:  Negative for dizziness, light-headedness and headaches.  ?Physical Exam  ? ?Blood pressure 128/62, pulse (!) 103, temperature 98.4 ?F (36.9 ?C), temperature source Oral, resp. rate 19, height $RemoveBe'5\' 3"'wMSZABzko$  (1.6 m), weight 76.9 kg, last menstrual period 08/28/2021. ? ?Physical Exam ?Vitals reviewed.  ?  Constitutional:   ?   Appearance: Normal appearance.  ?HENT:  ?   Head: Normocephalic and atraumatic.  ?Eyes:  ?   Conjunctiva/sclera: Conjunctivae normal.  ?Cardiovascular:  ?   Rate and Rhythm: Normal rate and regular rhythm.  ?Pulmonary:  ?   Effort: Pulmonary effort is normal. No respiratory distress.  ?   Breath sounds: Normal breath sounds.  ?Chest:  ? ? ?   Comments: Area of tenderness ?Abdominal:  ?   General: Bowel sounds are normal.  ?   Palpations: Abdomen is soft.  ?   Tenderness: There is abdominal tenderness in the right upper quadrant.  ?   Comments: Gravid, Appears AGA. Soft, No fundal tenderness.   ?Musculoskeletal:     ?   General: Normal range of motion.  ?   Cervical back: Normal range of motion.  ?Skin: ?   General: Skin is warm and dry.  ?Neurological:  ?   Mental Status: She is alert.  ?Psychiatric:     ?   Mood and Affect: Mood normal.     ?    Behavior: Behavior normal.  ? ? ?Fetal Assessment ?145 bpm, Mod Var, -Decels, +Accels ?Toco: No ctx graphed ? ?MAU Course  ? ?Results for orders placed or performed during the hospital encounter of 04/04/22 (from the past 24 hour(s))  ?Urinalysis, Routine w reflex microscopic Urine, Clean Catch     Status: Abnormal  ? Collection Time: 04/04/22  4:08 PM  ?Result Value Ref Range  ? Color, Urine YELLOW YELLOW  ? APPearance CLEAR CLEAR  ? Specific Gravity, Urine 1.019 1.005 - 1.030  ? pH 6.0 5.0 - 8.0  ? Glucose, UA NEGATIVE NEGATIVE mg/dL  ? Hgb urine dipstick NEGATIVE NEGATIVE  ? Bilirubin Urine NEGATIVE NEGATIVE  ? Ketones, ur 20 (A) NEGATIVE mg/dL  ? Protein, ur NEGATIVE NEGATIVE mg/dL  ? Nitrite NEGATIVE NEGATIVE  ? Leukocytes,Ua NEGATIVE NEGATIVE  ?Comprehensive metabolic panel     Status: Abnormal  ? Collection Time: 04/04/22  4:26 PM  ?Result Value Ref Range  ? Sodium 138 135 - 145 mmol/L  ? Potassium 3.3 (L) 3.5 - 5.1 mmol/L  ? Chloride 106 98 - 111 mmol/L  ? CO2 25 22 - 32 mmol/L  ? Glucose, Bld 102 (H) 70 - 99 mg/dL  ? BUN 8 6 - 20 mg/dL  ? Creatinine, Ser 0.55 0.44 - 1.00 mg/dL  ? Calcium 9.0 8.9 - 10.3 mg/dL  ? Total Protein 5.8 (L) 6.5 - 8.1 g/dL  ? Albumin 2.8 (L) 3.5 - 5.0 g/dL  ? AST 22 15 - 41 U/L  ? ALT 23 0 - 44 U/L  ? Alkaline Phosphatase 84 38 - 126 U/L  ? Total Bilirubin 0.4 0.3 - 1.2 mg/dL  ? GFR, Estimated >60 >60 mL/min  ? Anion gap 7 5 - 15  ?Amylase     Status: None  ? Collection Time: 04/04/22  4:26 PM  ?Result Value Ref Range  ? Amylase 99 28 - 100 U/L  ?CBC with Differential/Platelet     Status: Abnormal  ? Collection Time: 04/04/22  4:26 PM  ?Result Value Ref Range  ? WBC 13.5 (H) 4.0 - 10.5 K/uL  ? RBC 3.69 (L) 3.87 - 5.11 MIL/uL  ? Hemoglobin 10.1 (L) 12.0 - 15.0 g/dL  ? HCT 30.4 (L) 36.0 - 46.0 %  ? MCV 82.4 80.0 - 100.0 fL  ? MCH 27.4 26.0 - 34.0 pg  ? MCHC 33.2 30.0 - 36.0  g/dL  ? RDW 12.7 11.5 - 15.5 %  ? Platelets 251 150 - 400 K/uL  ? nRBC 0.0 0.0 - 0.2 %  ? Neutrophils Relative  % 74 %  ? Neutro Abs 10.0 (H) 1.7 - 7.7 K/uL  ? Lymphocytes Relative 14 %  ? Lymphs Abs 1.8 0.7 - 4.0 K/uL  ? Monocytes Relative 10 %  ? Monocytes Absolute 1.3 (H) 0.1 - 1.0 K/uL  ? Eosinophils Relative 1 %  ? Eosinophils Absolute 0.1 0.0 - 0.5 K/uL  ? Basophils Relative 0 %  ? Basophils Absolute 0.0 0.0 - 0.1 K/uL  ? Immature Granulocytes 1 %  ? Abs Immature Granulocytes 0.12 (H) 0.00 - 0.07 K/uL  ?Lipase, blood     Status: None  ? Collection Time: 04/04/22  4:26 PM  ?Result Value Ref Range  ? Lipase 37 11 - 51 U/L  ? ?US ABDOMEN LIMITED RUQ (LIVER/GB) ? ?Result Date: 04/04/2022 ?CLINICAL DATA:  Right upper quadrant pain for 3 days EXAM: ULTRASOUND ABDOMEN LIMITED RIGHT UPPER QUADRANT COMPARISON:  None Available. FINDINGS: Gallbladder: No evidence of cholelithiasis. Gallbladder is only minimally distended, limiting evaluation. No gallbladder wall thickening or pericholecystic fluid. No sonographic Murphy sign noted by sonographer. Common bile duct: Diameter: 2 mm Liver: No focal lesion identified. Within normal limits in parenchymal echogenicity. Portal vein is patent on color Doppler imaging with normal direction of blood flow towards the liver. Other: None. IMPRESSION: 1. Unremarkable right upper quadrant ultrasound. Electronically Signed   By: Randa Ngo M.D.   On: 04/04/2022 18:11   ? ?MDM ?PE ?Labs: Amylase, Lipase, CBC/D, CMP, PC Ratio ?EFM ?Pain Medication ?Antiemetic ?Assessment and Plan  ?21 year old G1P0  ?SIUP at 31.2 weeks ?Cat I FT ?RUQ Pain ?Nausea ? ?-POC Reviewed ?-Exam performed and findings discussed. ?-Labs ordered. ?-Discussed treatment with Zofran for nausea. Patient agreeable. ?-Zofran 8mg  ODT ordered. ?-Will follow with tylenol  ?-Plan to send for RUQ [place]. Patient informed that [place] will not evaluate fetus only organs. ?-Monitor and reassess.  ? ?Maryann Conners MSN, CNM ?04/04/2022, 4:03 PM  ? ?Reassessment (6:40 PM) ? ?-[place] results negative. ?-Labs w/o significant findings. ?-Provider to bedside  and patient reports improvement in pain. ?-Discussed costochondritis pain vs pregnancy pain. ?-Encouraged usage of tylenol, warm/cold compresses, and rest.  ?-Patient verbalizes understanding. ?-NS

## 2022-04-10 ENCOUNTER — Ambulatory Visit: Payer: Medicaid Other | Attending: Obstetrics

## 2022-04-10 ENCOUNTER — Ambulatory Visit: Payer: Medicaid Other | Admitting: *Deleted

## 2022-04-10 VITALS — BP 124/59 | HR 105

## 2022-04-10 DIAGNOSIS — Z362 Encounter for other antenatal screening follow-up: Secondary | ICD-10-CM

## 2022-04-10 DIAGNOSIS — O98119 Syphilis complicating pregnancy, unspecified trimester: Secondary | ICD-10-CM | POA: Insufficient documentation

## 2022-04-10 DIAGNOSIS — O2693 Pregnancy related conditions, unspecified, third trimester: Secondary | ICD-10-CM | POA: Diagnosis not present

## 2022-04-10 DIAGNOSIS — O98112 Syphilis complicating pregnancy, second trimester: Secondary | ICD-10-CM | POA: Insufficient documentation

## 2022-04-10 DIAGNOSIS — Z3A32 32 weeks gestation of pregnancy: Secondary | ICD-10-CM | POA: Insufficient documentation

## 2022-04-10 DIAGNOSIS — Z148 Genetic carrier of other disease: Secondary | ICD-10-CM | POA: Diagnosis not present

## 2022-04-10 DIAGNOSIS — Z3480 Encounter for supervision of other normal pregnancy, unspecified trimester: Secondary | ICD-10-CM | POA: Insufficient documentation

## 2022-04-10 DIAGNOSIS — O98113 Syphilis complicating pregnancy, third trimester: Secondary | ICD-10-CM | POA: Diagnosis not present

## 2022-04-10 DIAGNOSIS — A539 Syphilis, unspecified: Secondary | ICD-10-CM

## 2022-04-10 DIAGNOSIS — D563 Thalassemia minor: Secondary | ICD-10-CM | POA: Diagnosis not present

## 2022-04-10 DIAGNOSIS — O285 Abnormal chromosomal and genetic finding on antenatal screening of mother: Secondary | ICD-10-CM

## 2022-04-11 ENCOUNTER — Other Ambulatory Visit: Payer: Self-pay | Admitting: *Deleted

## 2022-04-11 DIAGNOSIS — O98113 Syphilis complicating pregnancy, third trimester: Secondary | ICD-10-CM

## 2022-04-18 ENCOUNTER — Ambulatory Visit (INDEPENDENT_AMBULATORY_CARE_PROVIDER_SITE_OTHER): Payer: Medicaid Other | Admitting: Family Medicine

## 2022-04-18 VITALS — BP 118/69 | HR 111 | Wt 169.2 lb

## 2022-04-18 DIAGNOSIS — O98119 Syphilis complicating pregnancy, unspecified trimester: Secondary | ICD-10-CM

## 2022-04-18 DIAGNOSIS — Z3480 Encounter for supervision of other normal pregnancy, unspecified trimester: Secondary | ICD-10-CM

## 2022-04-18 DIAGNOSIS — Z3A33 33 weeks gestation of pregnancy: Secondary | ICD-10-CM

## 2022-04-18 DIAGNOSIS — Z3009 Encounter for other general counseling and advice on contraception: Secondary | ICD-10-CM

## 2022-04-18 NOTE — Progress Notes (Signed)
?  Subjective:  ?Julie Cherry is a 21 y.o. G1P0 at [redacted]w[redacted]d being seen today for ongoing prenatal care.  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 ? ?Patient reports no complaints.  Contractions: Irritability. Vag. Bleeding: None.  Movement: Present. Denies leaking of fluid.  ? ?The following portions of the patient's history were reviewed and updated as appropriate: allergies, current medications, past family history, past medical history, past social history, past surgical history and problem list. Problem list updated. ? ?Objective:  ? ?Vitals:  ? 04/18/22 1616  ?BP: 118/69  ?Pulse: (!) 111  ?Weight: 169 lb 3.2 oz (76.7 kg)  ? ? ?Fetal Status: Fetal Heart Rate (bpm): 156   Movement: Present    ? ?General:  Alert, oriented and cooperative. Patient is in no acute distress.  ?Skin: Skin is warm and dry. No rash noted.   ?Cardiovascular: Normal heart rate noted  ?Respiratory: Normal respiratory effort, no problems with respiration noted  ?Abdomen: Soft, gravid, appropriate for gestational age. Pain/Pressure: Present     ?Pelvic: Vag. Bleeding: None     ?Cervical exam deferred        ?Extremities: Normal range of motion.  Edema: None  ?Mental Status: Normal mood and affect. Normal behavior. Normal judgment and thought content.  ? ? ?Assessment and Plan:  ?Pregnancy: G1P0 at [redacted]w[redacted]d ? ?1. Supervision of other normal pregnancy, antepartum ?Doing well, no concerns today. Normal heart tones. Fundal height appropriate for GA ?- continue routine care ?- followup in 2 weeks ? ?2. [redacted] weeks gestation of pregnancy ? ? ?3. Syphilis in pregnancy ?Treated on 1/26, 2/6, and 2/9 at [facility] ?Growth [place] normal. Repeat  growth scheduled on 6/8 ? ?4. Contraception counseling ?Wants Nexplanon. ? ?Preterm labor symptoms and general obstetric precautions including but not limited to vaginal bleeding, contractions, leaking of fluid and fetal movement were reviewed in detail with the patient. ?Please refer to After Visit Summary for other counseling recommendations.  ?Return in about 2 weeks (around 05/02/2022) for [place], any provider. ? ?Renard Matter, MD, MPH ?OB Fellow, Faculty Practice ? ?

## 2022-05-09 ENCOUNTER — Encounter: Payer: Self-pay | Admitting: Advanced Practice Midwife

## 2022-05-09 ENCOUNTER — Ambulatory Visit (INDEPENDENT_AMBULATORY_CARE_PROVIDER_SITE_OTHER): Payer: Medicaid Other | Admitting: Advanced Practice Midwife

## 2022-05-09 ENCOUNTER — Other Ambulatory Visit (HOSPITAL_COMMUNITY)
Admission: RE | Admit: 2022-05-09 | Discharge: 2022-05-09 | Disposition: A | Discharge status: Home / Self Care | Payer: Medicaid Other | Source: Ambulatory Visit | Attending: Advanced Practice Midwife | Admitting: Advanced Practice Midwife

## 2022-05-09 VITALS — BP 123/72 | HR 95 | Wt 175.2 lb

## 2022-05-09 DIAGNOSIS — Z3480 Encounter for supervision of other normal pregnancy, unspecified trimester: Secondary | ICD-10-CM | POA: Diagnosis not present

## 2022-05-09 DIAGNOSIS — Z3A36 36 weeks gestation of pregnancy: Secondary | ICD-10-CM

## 2022-05-09 DIAGNOSIS — O98119 Syphilis complicating pregnancy, unspecified trimester: Secondary | ICD-10-CM

## 2022-05-09 NOTE — Progress Notes (Signed)
   PRENATAL VISIT NOTE  Subjective:  Julie Cherry is a 21 y.o. G1P0 at [redacted]w[redacted]d being seen today for ongoing prenatal care.  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  Patient reports no complaints.  Contractions: Not present. Vag. Bleeding: None.  Movement: Present. Denies leaking of fluid.   The following portions of the patient's history were reviewed and updated as appropriate: allergies, current medications, past family history, past medical history, past social history, past surgical history and problem list.   Objective:   Vitals:   05/09/22 1449  BP: 123/72  Pulse: 95  Weight: 175 lb 3.2 oz (79.5 kg)    Fetal Status: Fetal Heart Rate (bpm): 162 Fundal Height: 36 cm Movement: Present  Presentation: Vertex  General:  Alert, oriented and cooperative. Patient is in no acute distress.  Skin: Skin is warm and dry. No rash noted.   Cardiovascular: Normal heart rate noted  Respiratory: Normal respiratory effort, no problems with respiration noted  Abdomen: Soft, gravid, appropriate for gestational age.  Pain/Pressure: Present     Pelvic: Cervical exam performed in the presence of a chaperone Dilation: Closed Effacement (%): 70 Station: -3  Extremities: Normal range of motion.  Edema: Trace  Mental Status: Normal mood and affect. Normal behavior. Normal judgment and thought content.   Assessment and Plan:  Pregnancy: G1P0 at [redacted]w[redacted]d 1. Supervision of other normal pregnancy, antepartum --Anticipatory guidance about next visits/weeks of pregnancy given.  --GBS and GCC collected today  2. Syphilis in pregnancy, antepartum --S/P tx --Titers from 1:16 to 1:8 --F/U  with [facility]  3. [redacted] weeks gestation of pregnancy   Preterm labor symptoms and general obstetric precautions including but not limited to vaginal bleeding, contractions, leaking of fluid and fetal movement were reviewed in detail with the patient. Please refer to After Visit Summary for other counseling recommendations.   Return in about 1 week (around 05/16/2022) for LOB, Any provider.  Future Appointments  Date Time Provider Department Center  05/11/2022  2:15 PM [facility] NURSE [facility]  05/11/2022  2:30 PM [facility] US2 [facility]    Sharen Counter, CNM

## 2022-05-09 NOTE — Patient Instructions (Signed)
Things to Try After 37 weeks to Encourage Labor/Get Ready for Labor:    Try the Miles Circuit at www.milescircuit.com daily to improve baby's position and encourage the onset of labor.  Walk a little and rest a little every day.  Change positions often.  Cervical Ripening: May try one or both Red Raspberry Leaf capsules or tea:  two 300mg or 400mg tablets with each meal, 2-3 times a day, or 1-3 cups of tea daily  Potential Side Effects Of Raspberry Leaf:  Most women do not experience any side effects from drinking raspberry leaf tea. However, nausea and loose stools are possible   Evening Primrose Oil capsules: take 1 capsule by mouth and place one capsule in the vagina every night.    Some of the potential side effects:  Upset stomach  Loose stools or diarrhea  Headaches  Nausea  Sex can also help the cervix ripen and encourage labor onset.    Labor Precautions Reasons to come to MAU at [facility]:  1.  Contractions are  5 minutes apart or less, each last 1 minute, these have been going on for 1-2 hours, and you cannot walk or talk during them 2.  You have a large gush of fluid, or a trickle of fluid that will not stop and you have to wear a pad 3.  You have bleeding that is bright red, heavier than spotting--like menstrual bleeding (spotting can be normal in early labor or after a check of your cervix) 4.  You do not feel the baby moving like he/she normally does  

## 2022-05-10 LAB — CERVICOVAGINAL ANCILLARY ONLY
Chlamydia: NEGATIVE
Comment: NEGATIVE
Comment: NORMAL
Neisseria Gonorrhea: NEGATIVE

## 2022-05-11 ENCOUNTER — Ambulatory Visit: Payer: Medicaid Other | Admitting: *Deleted

## 2022-05-11 ENCOUNTER — Ambulatory Visit: Payer: Medicaid Other | Attending: Obstetrics

## 2022-05-11 VITALS — BP 126/60 | HR 94

## 2022-05-11 DIAGNOSIS — O285 Abnormal chromosomal and genetic finding on antenatal screening of mother: Secondary | ICD-10-CM

## 2022-05-11 DIAGNOSIS — D563 Thalassemia minor: Secondary | ICD-10-CM

## 2022-05-11 DIAGNOSIS — A539 Syphilis, unspecified: Secondary | ICD-10-CM | POA: Diagnosis not present

## 2022-05-11 DIAGNOSIS — O98112 Syphilis complicating pregnancy, second trimester: Secondary | ICD-10-CM

## 2022-05-11 DIAGNOSIS — O98113 Syphilis complicating pregnancy, third trimester: Secondary | ICD-10-CM

## 2022-05-11 DIAGNOSIS — Z362 Encounter for other antenatal screening follow-up: Secondary | ICD-10-CM | POA: Insufficient documentation

## 2022-05-11 DIAGNOSIS — Z3480 Encounter for supervision of other normal pregnancy, unspecified trimester: Secondary | ICD-10-CM

## 2022-05-11 DIAGNOSIS — Z3A36 36 weeks gestation of pregnancy: Secondary | ICD-10-CM

## 2022-05-11 DIAGNOSIS — Z148 Genetic carrier of other disease: Secondary | ICD-10-CM | POA: Insufficient documentation

## 2022-05-13 LAB — CULTURE, BETA STREP (GROUP B ONLY): Strep Gp B Culture: NEGATIVE

## 2022-05-19 ENCOUNTER — Ambulatory Visit (INDEPENDENT_AMBULATORY_CARE_PROVIDER_SITE_OTHER): Payer: Medicaid Other | Admitting: Obstetrics

## 2022-05-19 ENCOUNTER — Encounter: Payer: Self-pay | Admitting: Obstetrics

## 2022-05-19 VITALS — BP 116/69 | HR 94 | Wt 176.6 lb

## 2022-05-19 DIAGNOSIS — Z3A37 37 weeks gestation of pregnancy: Secondary | ICD-10-CM

## 2022-05-19 DIAGNOSIS — O98112 Syphilis complicating pregnancy, second trimester: Secondary | ICD-10-CM

## 2022-05-19 DIAGNOSIS — Z3480 Encounter for supervision of other normal pregnancy, unspecified trimester: Secondary | ICD-10-CM

## 2022-05-19 NOTE — Progress Notes (Unsigned)
Subjective:  Julie Cherry is a 21 y.o. G1P0 at [redacted]w[redacted]d being seen today for ongoing prenatal care.  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  Patient reports no complaints.  Contractions: Not present. Vag. Bleeding: None.  Movement: Present. Denies leaking of fluid.   The following portions of the patient's history were reviewed and updated as appropriate: allergies, current medications, past family history, past medical history, past social history, past surgical history and problem list. Problem list updated.  Objective:   Vitals:   05/19/22 1000  BP: 116/69  Pulse: 94  Weight: 176 lb 9.6 oz (80.1 kg)    Fetal Status: Fetal Heart Rate (bpm): 157   Movement: Present     General:  Alert, oriented and cooperative. Patient is in no acute distress.  Skin: Skin is warm and dry. No rash noted.   Cardiovascular: Normal heart rate noted  Respiratory: Normal respiratory effort, no problems with respiration noted  Abdomen: Soft, gravid, appropriate for gestational age. Pain/Pressure: Absent     Pelvic:  Cervical exam deferred        Extremities: Normal range of motion.  Edema: None  Mental Status: Normal mood and affect. Normal behavior. Normal judgment and thought content.   Urinalysis:      Assessment and Plan:  Pregnancy: G1P0 at [redacted]w[redacted]d  1. Supervision of other normal pregnancy, antepartum  2. 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 Please refer to After  Visit Summary for other counseling recommendations.   Return in about 1 week (around 05/26/2022) for ROB.   I have spent a total of 20 minutes of face-to-face time, excluding clinical staff time, reviewing notes and preparing to see patient, ordering tests and/or medications, and counseling the patient.    Brock Bad, MD  05/19/22

## 2022-05-19 NOTE — Progress Notes (Unsigned)
Pt presents for ROB. No unusual complaints.

## 2022-05-25 ENCOUNTER — Ambulatory Visit (INDEPENDENT_AMBULATORY_CARE_PROVIDER_SITE_OTHER): Payer: Medicaid Other | Admitting: Obstetrics and Gynecology

## 2022-05-25 VITALS — BP 119/63 | HR 96 | Wt 176.5 lb

## 2022-05-25 DIAGNOSIS — Z3480 Encounter for supervision of other normal pregnancy, unspecified trimester: Secondary | ICD-10-CM

## 2022-05-25 DIAGNOSIS — Z3A38 38 weeks gestation of pregnancy: Secondary | ICD-10-CM

## 2022-05-25 DIAGNOSIS — O98112 Syphilis complicating pregnancy, second trimester: Secondary | ICD-10-CM

## 2022-05-25 NOTE — Progress Notes (Signed)
   PRENATAL VISIT NOTE  Subjective:  Julie Cherry is a 21 y.o. G1P0 at [redacted]w[redacted]d being seen today for ongoing prenatal care.  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  Patient reports no complaints.  Contractions: Not present. Vag. Bleeding: None.  Movement: Present. Denies leaking of fluid.   The following portions of the patient's history were reviewed and updated as appropriate: allergies, current medications, past family history, past medical history, past social history, past surgical history and problem list.   Objective:   Vitals:   05/25/22 1431  BP: 119/63  Pulse: 96  Weight: 176 lb 8 oz (80.1 kg)    Fetal Status: Fetal Heart Rate (bpm): 150 Fundal Height: 38 cm Movement: Present  Presentation: Undeterminable  General:  Alert, oriented and cooperative. Patient is in no acute distress.  Skin: Skin is warm and dry. No rash noted.   Cardiovascular: Normal heart rate noted  Respiratory: Normal respiratory effort, no problems with respiration noted  Abdomen: Soft, gravid, appropriate for gestational age.  Pain/Pressure: Absent     Pelvic: Cervical exam deferred Dilation: Fingertip Effacement (%): Thick Station: -3  Extremities: Normal range of motion.  Edema: None  Mental Status: Normal mood and affect. Normal behavior. Normal judgment and thought content.   Assessment and Plan:  Pregnancy: G1P0 at [redacted]w[redacted]d  1. Supervision of other normal pregnancy, antepartum  Desires natural birth Recommend primrose oil & nipple stimulation  Briefly discussed induction with foley insertion.   2. 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 Please refer to After Visit Summary for other counseling recommendations.   No follow-ups on file.  Future Appointments  Date Time Provider Department Center  06/01/2022  1:30 PM Allayne Stack, DO [facility] None  06/08/2022  1:50 PM Warden Fillers, MD [facility] None    Venia Carbon, NP

## 2022-05-25 NOTE — Progress Notes (Signed)
Patient presents for ROB. Patient has no concerns today. Patient would like to have cervix checked.

## 2022-06-01 ENCOUNTER — Encounter: Payer: Self-pay | Admitting: Family Medicine

## 2022-06-01 ENCOUNTER — Ambulatory Visit (INDEPENDENT_AMBULATORY_CARE_PROVIDER_SITE_OTHER): Payer: Medicaid Other | Admitting: Family Medicine

## 2022-06-01 VITALS — BP 119/72 | HR 98 | Wt 177.0 lb

## 2022-06-01 DIAGNOSIS — O98113 Syphilis complicating pregnancy, third trimester: Secondary | ICD-10-CM

## 2022-06-01 DIAGNOSIS — D563 Thalassemia minor: Secondary | ICD-10-CM

## 2022-06-01 DIAGNOSIS — Z3480 Encounter for supervision of other normal pregnancy, unspecified trimester: Secondary | ICD-10-CM

## 2022-06-01 DIAGNOSIS — Z3A39 39 weeks gestation of pregnancy: Secondary | ICD-10-CM

## 2022-06-01 NOTE — Progress Notes (Signed)
Pt presents for ROB. No concerns today.  

## 2022-06-01 NOTE — Patient Instructions (Signed)
These supplements and herbs are available over the counter without a prescription. They are often in the vitamin section of a pharmacy.   For pregnancy Blood Builder Magnesium - get Calm drink or gummies __________________________________________________________________________________________  To help ripen your Cervix/prepare the uterus (to get your cervix ready for labor):   Red Raspberry Leaf capsules:  two 300mg  or 400mg  tablets with each meal, 2-3 times a day  Potential Side Effects Of Raspberry Leaf:  Most women do not experience any side effects from drinking raspberry leaf tea. However, nausea and loose stools are possible. This can cause uterine irritability. If you notice having many braxton hicks contractions, stop this supplement.    Evening Primrose Oil capsules: may take 1 to 3 capsules daily. May also prick one to release the oil and insert it into your vagina at night.  One regimen would be to take 1 tablets three times per day and place 2 tablets in the vagina at night.   Some of the potential side effects:  Upset stomach  Loose stools or diarrhea  Headaches  Nausea   _____________________________________________________________________________________________  For Labor: All of these can be use in the last trimester of pregnancy  5-6 Dates a day -- This can help shorten your labor (may taste better if warmed in microwave until soft). This can also be combined with almond butter, any other nut butter, or wrap in bacon and bake, or toss in smoothies. Can also eat bars. Found where raisins are in the grocery store   ______________________________________________________________________________________________   The [employer]uit  This circuit takes at least 90 minutes to complete so clear your schedule and make mental preparations so you can relax in your environment. The second step requires a lot of pillows so gather them up before beginning Before  starting, you should empty your bladder! Have a nice drink nearby, and make sure it has a straw! If you are having contractions, this circuit should be done through contractions, try not to change positions between steps Before you begin...  "I named this 'circuit' after my friend , who shared and discussed it with me when I was working with a client whose labor seemed to be stalled out and no longer progressing... This circuit is useful to help get the baby lined up, ideally, in the "Left Occiput Anterior" (LOA) Position, both before labor begins and when some corrections need to be done during labor. Prenatally, this position set can help to rotate a baby. As a natural method of induction, this can help get things going if baby just needed a gentle nudge of position to set things off. To the best of my knowledge, this group of positions will not "hurt" a baby that is already lined up correctly." - [place]   Step One: Open-knee Chest Stay in this position for 30 minutes, start in cat/cow, then drop your chest as low as you can to the bed or the floor and your bottom as high as you can. Knees should be fairly wide apart, and the angle between the torso/thighs should be wider than 90 degrees. Wiggle around, prop with lots of pillows and use this time to get totally relaxed. This position allows the baby to scoot out of the pelvis a bit and gives them room to rotate, shift their head position, etc. If the pregnant person finds it helpful, careful positioning with a rebozo under the belly, with gentle tension from a support person behind can help maintain this position for  the full 30 minutes.  Step QIW:[id number] Left Side Lying Roll to your left side, bringing your top leg as high as possible and keeping your bottom leg straight. Roll forward as much as possible, again using a lot of pillows. Sink into the bed and relax some more. If you fall asleep,  that's totally okay and you can stay there! If not, stay here for at least another half an hour. Try and get your top right leg up towards your head and get as rolled over onto your belly as much as possible. If you repeat the circuit during labor, try alternating left and right sides. We know the photo the left is actually right side... just flip the image in your head.  Step Three: Moving and Lunges Lunge, walk stairs facing sideways, 2 at a time, (have a spotter downstairs of you!), take a walk outside with one foot on the curb and the other on the street, sit on a birth ball and hula- anything that's upright and putting your pelvis in open, asymmetrical positions. Spend at least 30 minutes doing this one as well to give your baby a chance to move down. If you are lunging or stair or curb walking, you should lunge/walk/go up stairs in the direction that feels better to you. The key with the lunge is that the toes of the higher leg and mom's belly button should be at right angles. Do not lunge over your knee, that closes the pelvis.     Megan Hamilton Miles: Tourist information centre manager - [email or web address] Sharlyn Bologna, CD, BDT (DONA), LCCE, FACCE: Supporting Content - [email or web address] Rulon Eisenmenger: Photography - [email or web address] Luther Hearing CD/CDT [facility]): Print and Webmaster - [email or web address] [employer] [email or web address]

## 2022-06-01 NOTE — Progress Notes (Signed)
    Subjective:  Julie Cherry is a 21 y.o. G1P0 at [redacted]w[redacted]d being seen today for ongoing prenatal care.  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  Patient reports no complaints.  Wants her body to go into spontaneous labor. Contractions: Not present. Vag. Bleeding: None.  Movement: Present. Denies leaking of fluid.   The following portions of the patient's history were reviewed and updated as appropriate: allergies, current medications, past family history, past medical history, past social history, past surgical history and problem list.   Objective:   Vitals:   06/01/22 1340  BP: 119/72  Pulse: 98  Weight: 177 lb (80.3 kg)    Fetal Status: Fetal Heart Rate (bpm): 155 Fundal Height: 39 cm Movement: Present     General:  Alert, oriented and cooperative. Patient is in no acute distress.  Skin: Skin is warm and dry. No rash noted.   Cardiovascular: Normal heart rate noted  Respiratory: Normal respiratory effort, no problems with respiration noted  Abdomen: Soft, gravid, appropriate for gestational age. Pain/Pressure: Present     Pelvic:  Cervical exam deferred Dilation: Fingertip Effacement (%): Thick Station: -3  Extremities: Normal range of motion.  Edema: None  Mental Status: Normal mood and affect. Normal behavior. Normal judgment and thought content.    Assessment and Plan:  Pregnancy: G1P0 at [redacted]w[redacted]d  1. Supervision of other normal pregnancy, antepartum Doing well. Scheduled for 41 week IOL today if not delivered prior to. She is hopeful for minimal intervention.   2. [redacted] weeks gestation of pregnancy Vigorous fetal movement.   3. Syphilis affecting pregnancy  in third trimester PCN x3 in 1-01/2022.   4. Alpha thalassemia silent carrier   Term labor symptoms and general obstetric precautions including but not limited to vaginal bleeding, contractions, leaking of fluid and fetal movement were reviewed in detail with the patient. Please refer to After Visit Summary for other counseling recommendations.   Return in about 1 week (around 06/08/2022) for LROB.   Julie Stack, DO

## 2022-06-08 ENCOUNTER — Ambulatory Visit (INDEPENDENT_AMBULATORY_CARE_PROVIDER_SITE_OTHER): Payer: Medicaid Other | Admitting: Obstetrics and Gynecology

## 2022-06-08 VITALS — BP 125/74 | HR 99 | Wt 179.7 lb

## 2022-06-08 DIAGNOSIS — Z3480 Encounter for supervision of other normal pregnancy, unspecified trimester: Secondary | ICD-10-CM

## 2022-06-08 DIAGNOSIS — O98113 Syphilis complicating pregnancy, third trimester: Secondary | ICD-10-CM

## 2022-06-08 DIAGNOSIS — Z3A40 40 weeks gestation of pregnancy: Secondary | ICD-10-CM | POA: Diagnosis not present

## 2022-06-08 DIAGNOSIS — O480 Post-term pregnancy: Secondary | ICD-10-CM

## 2022-06-08 DIAGNOSIS — D563 Thalassemia minor: Secondary | ICD-10-CM

## 2022-06-08 NOTE — Progress Notes (Signed)
   PRENATAL VISIT NOTE  Subjective:  Julie Cherry is a 21 y.o. G1P0 at [redacted]w[redacted]d being seen today for ongoing prenatal care.  She is currently monitored for the following issues for this high-risk pregnancy and has Heart murmur; Vitamin D deficiency; Congenital toe deformity- third left toe; Supervision of other normal pregnancy, antepartum; Syphilis affecting pregnancy in third trimester; Alpha thalassemia silent carrier; and Abnormal Pap smear of cervix on their problem list.  Patient doing well with no acute concerns today. She reports no complaints.  Contractions: Irritability. Vag. Bleeding: None.  Movement: Present. Denies leaking of fluid.   The following portions of the patient's history were reviewed and updated as appropriate: allergies, current medications, past family history, past medical history, past social history, past surgical history and problem list. Problem list updated.  Objective:   Vitals:   06/08/22 1356  BP: 125/74  Pulse: 99  Weight: 179 lb 11.2 oz (81.5 kg)    Fetal Status: Fetal Heart Rate (bpm): NST   Movement: Present    Baseline 150's General:  Alert, oriented and cooperative. Patient is in no acute distress.  Skin: Skin is warm and dry. No rash noted.   Cardiovascular: Normal heart rate noted  Respiratory: Normal respiratory effort, no problems with respiration noted  Abdomen: Soft, gravid, appropriate for gestational age.  Pain/Pressure: Present     Pelvic: Cervical exam deferred        Extremities: Normal range of motion.  Edema: None  Mental Status:  Normal mood and affect. Normal behavior. Normal judgment and thought content.  NST: baseline 150s, accels noted, no decels detected.  Fetal movement heard on monitor, reactive, category 1, toco: irritability Assessment and Plan:  Pregnancy: G1P0 at [redacted]w[redacted]d  1. Supervision of other normal pregnancy, antepartum Pt to be scheduled for IOL due to postdates  2. [redacted] weeks gestation of  pregnancy   3. Alpha thalassemia silent carrier   4. 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 Post term pregnancy over 40 weeks   Term labor symptoms and general obstetric precautions including but not limited to vaginal bleeding, contractions, leaking of fluid and fetal movement were reviewed in detail with the patient.  Please refer to After Visit Summary for other counseling recommendations.   No follow-ups on file.   Mariel Aloe, MD Faculty Attending Center for [facility]

## 2022-06-08 NOTE — Progress Notes (Signed)
Pt reports fetal movement with some contractions. 

## 2022-06-10 ENCOUNTER — Other Ambulatory Visit: Payer: Self-pay

## 2022-06-10 ENCOUNTER — Inpatient Hospital Stay (HOSPITAL_COMMUNITY)
Admission: AD | Admit: 2022-06-10 | Discharge: 2022-06-12 | DRG: 807 | Disposition: A | Discharge status: Home / Self Care | Payer: Medicaid Other | Attending: Obstetrics & Gynecology | Admitting: Obstetrics & Gynecology

## 2022-06-10 ENCOUNTER — Encounter (HOSPITAL_COMMUNITY): Payer: Self-pay | Admitting: Family Medicine

## 2022-06-10 DIAGNOSIS — D563 Thalassemia minor: Secondary | ICD-10-CM | POA: Diagnosis not present

## 2022-06-10 DIAGNOSIS — O480 Post-term pregnancy: Secondary | ICD-10-CM | POA: Diagnosis not present

## 2022-06-10 DIAGNOSIS — Z30017 Encounter for initial prescription of implantable subdermal contraceptive: Secondary | ICD-10-CM

## 2022-06-10 DIAGNOSIS — Z3A40 40 weeks gestation of pregnancy: Secondary | ICD-10-CM | POA: Diagnosis not present

## 2022-06-10 DIAGNOSIS — O98113 Syphilis complicating pregnancy, third trimester: Secondary | ICD-10-CM

## 2022-06-10 DIAGNOSIS — O9812 Syphilis complicating childbirth: Secondary | ICD-10-CM | POA: Diagnosis not present

## 2022-06-10 DIAGNOSIS — Z3480 Encounter for supervision of other normal pregnancy, unspecified trimester: Secondary | ICD-10-CM

## 2022-06-10 DIAGNOSIS — R87619 Unspecified abnormal cytological findings in specimens from cervix uteri: Secondary | ICD-10-CM | POA: Diagnosis present

## 2022-06-10 LAB — CBC
HCT: 34.2 % — ABNORMAL LOW (ref 36.0–46.0)
Hemoglobin: 10.8 g/dL — ABNORMAL LOW (ref 12.0–15.0)
MCH: 26 pg (ref 26.0–34.0)
MCHC: 31.6 g/dL (ref 30.0–36.0)
MCV: 82.2 fL (ref 80.0–100.0)
Platelets: 282 10*3/uL (ref 150–400)
RBC: 4.16 MIL/uL (ref 3.87–5.11)
RDW: 14.5 % (ref 11.5–15.5)
WBC: 12.8 10*3/uL — ABNORMAL HIGH (ref 4.0–10.5)
nRBC: 0 % (ref 0.0–0.2)

## 2022-06-10 LAB — TYPE AND SCREEN
ABO/RH(D): B POS
Antibody Screen: NEGATIVE

## 2022-06-10 LAB — RPR
RPR Ser Ql: REACTIVE — AB
RPR Titer: 1:4 {titer}

## 2022-06-10 MED ORDER — ONDANSETRON HCL 4 MG PO TABS: 4.0000 mg | ORAL_TABLET | ORAL | Status: DC | PRN

## 2022-06-10 MED ORDER — OXYTOCIN BOLUS FROM INFUSION
333.0000 mL | Freq: Once | INTRAVENOUS | Status: AC
  Administered 2022-06-10: 333 mL via INTRAVENOUS

## 2022-06-10 MED ORDER — OXYCODONE-ACETAMINOPHEN 5-325 MG PO TABS: 2.0000 | ORAL_TABLET | ORAL | Status: DC | PRN

## 2022-06-10 MED ORDER — EPHEDRINE 5 MG/ML INJ: 10.0000 mg | INTRAVENOUS | Status: DC | PRN

## 2022-06-10 MED ORDER — PHENYLEPHRINE 80 MCG/ML (10ML) SYRINGE FOR IV PUSH (FOR BLOOD PRESSURE SUPPORT): 80.0000 ug | PREFILLED_SYRINGE | INTRAVENOUS | Status: DC | PRN

## 2022-06-10 MED ORDER — FENTANYL-BUPIVACAINE-NACL 0.5-0.125-0.9 MG/250ML-% EP SOLN: 12.0000 mL/h | EPIDURAL | Status: DC | PRN

## 2022-06-10 MED ORDER — ACETAMINOPHEN 325 MG PO TABS: 650.0000 mg | ORAL_TABLET | ORAL | Status: DC | PRN

## 2022-06-10 MED ORDER — FENTANYL CITRATE (PF) 100 MCG/2ML IJ SOLN
50.0000 ug | INTRAMUSCULAR | Status: DC | PRN
  Administered 2022-06-10: 50 ug via INTRAVENOUS
  Filled 2022-06-10: qty 2

## 2022-06-10 MED ORDER — LIDOCAINE HCL (PF) 1 % IJ SOLN
30.0000 mL | INTRAMUSCULAR | Status: AC | PRN
  Administered 2022-06-10: 30 mL via SUBCUTANEOUS
  Filled 2022-06-10: qty 30

## 2022-06-10 MED ORDER — OXYCODONE HCL 5 MG PO TABS: 5.0000 mg | ORAL_TABLET | ORAL | Status: DC | PRN

## 2022-06-10 MED ORDER — SOD CITRATE-CITRIC ACID 500-334 MG/5ML PO SOLN: 30.0000 mL | ORAL | Status: DC | PRN

## 2022-06-10 MED ORDER — LACTATED RINGERS IV SOLN: INTRAVENOUS | Status: DC

## 2022-06-10 MED ORDER — WITCH HAZEL-GLYCERIN EX PADS: 1.0000 | MEDICATED_PAD | CUTANEOUS | Status: DC | PRN

## 2022-06-10 MED ORDER — OXYCODONE HCL 5 MG PO TABS: 10.0000 mg | ORAL_TABLET | ORAL | Status: DC | PRN

## 2022-06-10 MED ORDER — OXYCODONE-ACETAMINOPHEN 5-325 MG PO TABS: 1.0000 | ORAL_TABLET | ORAL | Status: DC | PRN

## 2022-06-10 MED ORDER — ONDANSETRON HCL 4 MG/2ML IJ SOLN: 4.0000 mg | Freq: Four times a day (QID) | INTRAMUSCULAR | Status: DC | PRN

## 2022-06-10 MED ORDER — BENZOCAINE-MENTHOL 20-0.5 % EX AERO
1.0000 | INHALATION_SPRAY | CUTANEOUS | Status: DC | PRN
  Filled 2022-06-10: qty 56

## 2022-06-10 MED ORDER — SIMETHICONE 80 MG PO CHEW: 80.0000 mg | CHEWABLE_TABLET | ORAL | Status: DC | PRN

## 2022-06-10 MED ORDER — OXYTOCIN-SODIUM CHLORIDE 30-0.9 UT/500ML-% IV SOLN
2.5000 [IU]/h | INTRAVENOUS | Status: DC
  Filled 2022-06-10: qty 500

## 2022-06-10 MED ORDER — IBUPROFEN 600 MG PO TABS
600.0000 mg | ORAL_TABLET | Freq: Four times a day (QID) | ORAL | Status: DC
  Administered 2022-06-11 – 2022-06-12 (×7): 600 mg via ORAL
  Filled 2022-06-10 (×8): qty 1

## 2022-06-10 MED ORDER — DIPHENHYDRAMINE HCL 25 MG PO CAPS: 25.0000 mg | ORAL_CAPSULE | Freq: Four times a day (QID) | ORAL | Status: DC | PRN

## 2022-06-10 MED ORDER — COCONUT OIL OIL: 1.0000 | TOPICAL_OIL | Status: DC | PRN

## 2022-06-10 MED ORDER — DIPHENHYDRAMINE HCL 50 MG/ML IJ SOLN: 12.5000 mg | INTRAMUSCULAR | Status: DC | PRN

## 2022-06-10 MED ORDER — LACTATED RINGERS IV SOLN: 500.0000 mL | Freq: Once | INTRAVENOUS | Status: DC

## 2022-06-10 MED ORDER — PRENATAL MULTIVITAMIN CH
1.0000 | ORAL_TABLET | Freq: Every day | ORAL | Status: DC
  Administered 2022-06-11 – 2022-06-12 (×2): 1 via ORAL
  Filled 2022-06-10 (×2): qty 1

## 2022-06-10 MED ORDER — SENNOSIDES-DOCUSATE SODIUM 8.6-50 MG PO TABS
2.0000 | ORAL_TABLET | Freq: Every day | ORAL | Status: DC
  Administered 2022-06-11 – 2022-06-12 (×2): 2 via ORAL
  Filled 2022-06-10 (×2): qty 2

## 2022-06-10 MED ORDER — DIBUCAINE (PERIANAL) 1 % EX OINT: 1.0000 | TOPICAL_OINTMENT | CUTANEOUS | Status: DC | PRN

## 2022-06-10 MED ORDER — LACTATED RINGERS IV SOLN: 500.0000 mL | INTRAVENOUS | Status: DC | PRN

## 2022-06-10 MED ORDER — ONDANSETRON HCL 4 MG/2ML IJ SOLN: 4.0000 mg | INTRAMUSCULAR | Status: DC | PRN

## 2022-06-10 NOTE — MAU Note (Signed)
.  Julie Cherry is a 21 y.o. at [redacted]w[redacted]d here in MAU reporting: ctx of and on all day. Now about 4 min apart. C/o mucus plug has come out but denies any vag bleeding or leaking at this time. Good fetal movement reported.   Onset of complaint: earlier today Pain score: 10 Vitals:   06/10/22 0035  BP: 132/65  Pulse: 69  Resp: 18  Temp: 98.1 F (36.7 C)     FHT:165 Lab orders placed from triage:  MAU Labor eval

## 2022-06-10 NOTE — Discharge Summary (Signed)
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 yo G1P0 at [redacted]w[redacted]d was admitted in Latent Labor on 06/10/2022. Patient had an uncomplicated labor course and progressed to complete after AROM. She had a normal vaginal delivery.  Membrane Rupture Time/Date: 2:25 AM ,06/10/2022   Delivery Method:Vaginal, Spontaneous  Episiotomy: None  Lacerations:  Perineal  Patient had an uncomplicated postpartum course.  She is ambulating, tolerating a regular diet, passing flatus, and urinating well.  Her RPR on admission was 1:4, which is improved from 1:16 s/p treatment in pregnancy.  Her pain and bleeding are controlled.  She is breast and formula*** feeding.  Patient is discharged home in stable condition on 06/10/22.  Newborn Data: Birth date:06/10/2022  Birth time:12:05 PM  Gender:Female  Living status:Living  Apgars: ,9  Weight:   Magnesium Sulfate received: No BMZ received: No Rhophylac: N/A MMR: N/A - Immune  T-DaP: Given prenatally Flu: No Transfusion: No  Physical exam  Vitals:   06/10/22 1005 06/10/22 1104 06/10/22 1215  06/10/22 1230  BP: (!) 113/56 104/61 126/69 (!) 122/58  Pulse: 100 73 97 68  Resp: $Remo'18 18 18 18  'Zrwfi$ Temp:  98.5 F (36.9 C)  99 F (37.2 C)  TempSrc:  Oral  Axillary   General: {Exam; general:21111117} Lochia: {Desc; appropriate/inappropriate:30686::"appropriate"} Uterine Fundus: {Desc; firm/soft:30687} Incision: {Exam; incision:21111123} DVT Evaluation: {Exam; dvt:2111122}  Labs: Lab Results  Component Value Date   WBC 12.8 (H) 06/10/2022   HGB 10.8 (L) 06/10/2022   HCT 34.2 (L) 06/10/2022   MCV 82.2 06/10/2022   PLT 282 06/10/2022      Latest Ref Rng & Units 04/04/2022    4:26 PM  CMP  Glucose 70 - 99 mg/dL 102   BUN 6 - 20 mg/dL 8   Creatinine 0.44 - 1.00 mg/dL 0.55   Sodium 135 - 145 mmol/L 138   Potassium 3.5 - 5.1 mmol/L 3.3   Chloride 98 - 111 mmol/L 106   CO2 22 - 32 mmol/L 25   Calcium 8.9 - 10.3 mg/dL 9.0   Total Protein 6.5 - 8.1 g/dL 5.8   Total Bilirubin 0.3 - 1.2 mg/dL 0.4   Alkaline Phos 38 - 126 U/L 84   AST 15 - 41 U/L 22   ALT 0 - 44 U/L 23    Edinburgh Score:     No data to display          After visit meds:  Allergies as of 06/10/2022  Reactions   Pork-derived Products    Patient does not eat pork but can have meds/products with pork      Med Rec must be completed prior to using this [place]***       Discharge home in stable condition Infant Feeding: {Baby feeding:23562} Infant Disposition: {CHL IP OB HOME WITH BOMQTT:27639} Discharge instruction: per After Visit Summary and Postpartum booklet. Activity: Advance as tolerated. Pelvic rest for 6 weeks.  Diet: routine diet Future Appointments:No future appointments. Follow up Visit: Message sent to [facility] by Dr. Gwenlyn Perking on 06/10/22 .   Please schedule this patient for a In person postpartum visit in 6 weeks with the following provider: Any provider. Additional Postpartum F/U: Repeat pap at postpartum visit   Low risk pregnancy complicated by: Syphilis in pregnancy (s/p  treatment)  Delivery mode:  Vaginal, Spontaneous  Anticipated Birth Control: Nexplanon  06/10/2022 Genia Del, MD

## 2022-06-10 NOTE — Lactation Note (Signed)
This note was copied from a baby's chart. Lactation Consultation Note  Patient Name: Julie Cherry Today's Date: 06/10/2022 Reason for consult: L&D Initial assessment Age:23 hours LC entered the room, infant was cuing to breastfeed. LC used breast model  to teach hand expression and mom self expressed a few drops of colostrum prior to latching infant at the breast. LC observed infant sucks on  her bottom lip and tongue, LC did suck training with infant,  to extend tongue downward and lower tongue and bottom jaw. Mom hasn't tried the football hold position or latched infant on her left breast. Mom latched infant on her left breast using the football hold position, LC lowered infant's jaw bringing infant to breast, infant sustained latch after few attempts, and was still breastfeeding after 17 minutes when LC left the room. Mom knows if infant is still cuing after breastfeeding  infant on 1st breast to offer 2nd breast within the same feeding. Mom knows to BF infant according to hunger cues, on demand, 8 to 12+ times within 24 hours, skin to skin. Mom made aware of O/P services, breastfeeding support groups, community resources, and our phone # for post-discharge questions.   Maternal Data Has patient been taught Hand Expression?: Yes Does the patient have breastfeeding experience prior to this delivery?: No  Feeding Mother's Current Feeding Choice: Breast Milk  LATCH Score Latch: Grasps breast easily, tongue down, lips flanged, rhythmical sucking.  Audible Swallowing: Spontaneous and intermittent  Type of Nipple: Flat  Comfort (Breast/Nipple): Soft / non-tender  Hold (Positioning): Assistance needed to correctly position infant at breast and maintain latch.  LATCH Score: 8   Lactation Tools Discussed/Used Tools: Pump Pump Education: Setup, frequency, and cleaning;Milk Storage Reason for Pumping: To help evert mokm's nipple shaft out more prior to latching infant ( Flat  nipples). Pumping frequency: Pre-pump prior to latching infant at the breast.  Interventions Interventions: Breast feeding basics reviewed;Assisted with latch;Skin to skin;Hand express;Pre-pump if needed;Breast compression;Adjust position;Support pillows;Position options;Expressed milk;Hand pump;Education;LC Services brochure  Discharge Pump: Personal (Per mom, she has Zomie DEBP at home.)  Consult Status Consult Status: Follow-up Date: 06/11/22 Follow-up type: In-patient    Julie Cherry 06/10/2022, 4:12 PM

## 2022-06-10 NOTE — Progress Notes (Signed)
Labor Progress Note Julie Cherry is a 21 y.o. G1P0 at [redacted]w[redacted]d who presented for SOL.  S: Doing okay. Feeling pain and wondering what her options are. FOB and patient's mom at bedside.   O:  BP 120/65   Pulse 87   Temp 98.3 F (36.8 C) (Oral)   Resp 16   LMP 08/28/2021 (Approximate)   EFM: Baseline 150 bpm, moderate variability, + accels, no decels  Toco: Every 2-4 minutes   CVE: Dilation: 4 Effacement (%): 100 Cervical Position: Middle Station: -2 Presentation: Vertex Exam by:: Madalyn Rob, RN  A&P: 21 y.o. G1P0 [redacted]w[redacted]d   #Labor: Progressing well. S/p AROM at 0225 (moderate mec). Reviewed options for pain management including IV pain medication, nitrous oxide gas, and epidural. Also reviewed alternative therapies including heat, walking, birthing ball, etc. Patient is considering nitrous. Would like to try application of heat first and reassess. Plan to recheck at 10:30, about 4 hours from last exam. Will augment with Pitocin as needed. All questions and concerns addressed.  #Pain: PRN #FWB: Cat 1  #GBS negative  Worthy Rancher, MD 9:31 AM

## 2022-06-10 NOTE — Lactation Note (Signed)
This note was copied from a baby's chart. Lactation Consultation Note  Patient Name: Julie Cherry Today's Date: 06/10/2022   Age:23 hours  LC attempted to visit mom. Mom states that she would like to be seen by lactation on the MBU.   Julie Cherry P Julie Cherry 06/10/2022, 1:19 PM

## 2022-06-10 NOTE — H&P (Addendum)
OBSTETRIC ADMISSION HISTORY AND PHYSICAL  Julie Cherry is a 21 y.o. female G1P0 with IUP at 29w6dby LMP presenting for SOL. She reports +FMs, No LOF, no VB, no blurry vision, headaches or peripheral edema, and RUQ pain.  She plans on breast feeding. She requests nexplanon for birth control. 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 [facility] 1-01/2022 Abnormal Pap with HGSIL Bacterial Vaginitis, Candida Vaginitis  Past Medical History: Past Medical History:  Diagnosis Date   Asthma    Last had symptoms age 21    At risk for sexually transmitted disease due to unprotected sex 08/06/2019   Breakthrough bleeding on Nexplanon 07/19/2017   Chlamydia 07/09/2019   Deliberate self-cutting 09/10/2013   Pyelonephritis 05/27/2016   Vaccination not carried out because of caregiver refusal 09/10/2013   Mom refuses flu and HPV vaccine    Past Surgical History: Past Surgical History:  Procedure Laterality Date   MOUTH SURGERY     TOOTH EXTRACTION  Aug 2014   severe decay    Obstetrical History: OB History     Gravida  1   Para      Term      Preterm      AB      Living         SAB      IAB      Ectopic      Multiple      Live Births              Social History Social History   Socioeconomic History   Marital status: Single    Spouse name: Not on file   Number of children: Not on file   Years of education: Not on file   Highest education level: Not on file  Occupational History   Not on file  Tobacco Use   Smoking status: Never   Smokeless tobacco: Never  Vaping Use   Vaping Use: Never used  Substance and Sexual Activity   Alcohol use: No   Drug use: No   Sexual activity: Yes    Partners: Male     Birth control/protection: Condom    Comment: NOV 2017  Other Topics Concern   Not on file  Social History Narrative   Not on file   Social Determinants of Health   Financial Resource Strain: Not on file  Food Insecurity: Not on file  Transportation Needs: Not on file  Physical Activity: Not on file  Stress: Not on file  Social Connections: Not on file    Family History: Family History  Problem Relation Age of Onset   Asthma Father    Diabetes Paternal Grandfather    Healthy Mother     Allergies: Allergies  Allergen Reactions   Pork-Derived Products     Patient does not eat pork but can have meds/products with pork     Medications Prior to Admission  Medication Sig Dispense Refill Last Dose   Prenatal Vit-Fe Fumarate-FA (MULTIVITAMIN-PRENATAL) 27-0.8 MG TABS tablet Take 1 tablet by mouth daily at 12 noon.   06/09/2022   Blood Pressure Monitoring (BLOOD PRESSURE KIT) DEVI 1 Device by Does not apply route as needed. (Patient not taking: Reported on  05/19/2022) 1 each 0      Review of Systems   All systems reviewed and negative except as stated in HPI  Blood pressure 132/65, pulse 69, temperature 98.1 F (36.7 C), resp. rate 18, last menstrual period 08/28/2021. General appearance: alert, cooperative, appears stated age, and no distress Lungs: clear to auscultation bilaterally Heart: regular rate and rhythm Abdomen: soft, non-tender; bowel sounds normal Presentation: cephalic Fetal monitoring: Baseline: 150 bpm, Variability: Good {> 6 bpm), Accelerations: Reactive, and Decelerations: Early x1 Uterine activityFrequency: Every 2-3 minutes and Duration: 60 seconds Dilation: 4 Effacement (%): 100 Station: -2, -1 Exam by:: K.Wilson,RN   Prenatal labs: ABO, Rh: B/Positive/-- (01/19 1425) Antibody: Negative (01/19 1425) Rubella: 2.86 (01/19 1425) RPR: Reactive (04/10 1035)  HBsAg: Negative (01/19 1425)  HIV: Non Reactive (04/10 1035)  GBS: Negative/-- (06/06  1526)  1 hr Glucola: Normal Genetic screening:  Alpha Thal Silent Carrier Anatomy US: Normal  Prenatal Transfer Tool  Maternal Diabetes: No Genetic Screening: Abnormal:  Results: Other: Alpha Thal Silent Carrier Maternal Ultrasounds/Referrals: Normal Fetal Ultrasounds or other Referrals:  None Maternal Substance Abuse:  No Significant Maternal Medications:  None Significant Maternal Lab Results: Group B Strep negative  No results found for this or any previous visit (from the past 24 hour(s)).  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 G1P0 at 48w6dhere for SOL.  #Labor: Patient is 4/100/-2 and would benefit from AROM. AROM performed and was thick meconium stained (pea soup colored). Will plan to have NICU at delivery. #Pain: IV pain med and Nitrous #FWB: Category I #ID:  GBS negative / Syphilis + in second trimester. Recheck RPR #MOF: Breast #MOC: Nexplanon  #HGSIL: Patient to have colposcopy 3-4 months post partum  BHolley Bouche MS[place]ter[place]for [facility] 06/10/2022, 1:28 AM  Attestation of Supervision of Student:  I confirm that I have verified the information documented in the  resident 's note and that I have also personally reperformed the history, physical exam and all medical decision making activities.  I have verified that all services and findings are accurately documented in this student's note; and I agree with management and plan as outlined in the documentation. I have  also made any necessary editorial changes.  RLaury Deep CP[place]for [employer]De[employer] CDumontGroup 06/10/2022 4:27 AM

## 2022-06-10 NOTE — Lactation Note (Signed)
This note was copied from a baby's chart. Lactation Consultation Note  Patient Name: Julie Cherry Today's Date: 06/10/2022   Age:23 hours P1, term female infant, mom requested LC services again when [facility] was in L&D. LC entered patient's room, per mom, infant recently finished BF for 20 minutes, RN on MBU assisted with latching infant at the breast.  Maternal Data    Feeding    LATCH Score                    Lactation Tools Discussed/Used    Interventions    Discharge    Consult Status      Danelle Earthly 06/10/2022, 11:49 PM

## 2022-06-11 ENCOUNTER — Inpatient Hospital Stay (HOSPITAL_COMMUNITY): Payer: Medicaid Other

## 2022-06-11 ENCOUNTER — Inpatient Hospital Stay (HOSPITAL_COMMUNITY)
Admission: AD | Admit: 2022-06-11 | Payer: Medicaid Other | Source: Home / Self Care | Admitting: Obstetrics & Gynecology

## 2022-06-11 DIAGNOSIS — Z30017 Encounter for initial prescription of implantable subdermal contraceptive: Secondary | ICD-10-CM | POA: Diagnosis not present

## 2022-06-11 MED ORDER — IBUPROFEN 600 MG PO TABS: 600.0000 mg | ORAL_TABLET | Freq: Four times a day (QID) | ORAL | 0 refills | Status: DC | PRN

## 2022-06-11 MED ORDER — ETONOGESTREL 68 MG ~~LOC~~ IMPL
68.0000 mg | DRUG_IMPLANT | Freq: Once | SUBCUTANEOUS | Status: AC
  Administered 2022-06-11: 68 mg via SUBCUTANEOUS
  Filled 2022-06-11: qty 1

## 2022-06-11 MED ORDER — LIDOCAINE HCL 1 % IJ SOLN
0.0000 mL | Freq: Once | INTRAMUSCULAR | Status: AC | PRN
  Administered 2022-06-11: 20 mL via INTRADERMAL
  Filled 2022-06-11: qty 20

## 2022-06-11 NOTE — Procedures (Signed)
  Procedure Note: Nexplanon insertion  Patient is a 21 y.o. G1P1001 now PPD# 1 from VD. She desires long-term reversible contraception.  Risks/benefits/side effects of Nexplanon have been discussed with patient and her questions have been answered. Patient is aware of the common side effect of irregular bleeding, which the incidence of decreases over time.  BP 126/60 (BP Location: Left Arm)   Pulse 78   Temp 98 F (36.7 C) (Oral)   Resp 16   LMP 08/28/2021 (Approximate)   SpO2 100%   Breastfeeding Unknown   No results found for this or any previous visit (from the past 24 hour(s)).   She is right-handed, so her left arm, approximately 10 cm proximal from the elbow in previous nexplanon location, was cleansed with alcohol and anesthetized with 2 mL of 1% Lidocaine with Epi.  The area was cleansed again with betadine and the Nexplanon was inserted per manufacturer's recommendations without difficulty.  A pressure bandage were applied.  Pt was instructed to keep the area clean and dry, remove pressure bandage in 24 hours.  She was given a card indicating date Nexplanon was inserted and date it needs to be removed. Follow-up PRN problems.  Leticia Penna, D.O. OB Fellow  06/11/2022, 7:18 PM

## 2022-06-11 NOTE — Lactation Note (Signed)
This note was copied from a baby's chart. Lactation Consultation Note  Patient Name: Girl Kami Kube OZDGU'Y Date: 06/11/2022 Reason for consult: Follow-up assessment;1st time breastfeeding;Term;Infant weight loss (-4% weight loss) Age:23 hours Per mom, infant is breastfeeding well, she has no breastfeeding concerns at this time, infant recently BF for 30 minutes prior to [facility] entering the room. Mom will continue to breastfeeding infant according to hunger cues, on demand, 8 to 12 times within 24 hours, skin to skin. Mom doesn't have any BF questions or concerns regarding breastfeeding at this time. Mom is using coconut oil for breast soreness and mom knows to break infant's latch if she feels discomfort with latch.  Maternal Data    Feeding Mother's Current Feeding Choice: Breast Milk  LATCH Score                    Lactation Tools Discussed/Used    Interventions Interventions: Position options;Education  Discharge    Consult Status Consult Status: Follow-up Date: 06/12/22 Follow-up type: In-patient    Danelle Earthly 06/11/2022, 10:28 PM

## 2022-06-11 NOTE — Progress Notes (Signed)
MOB was referred for history of self harm. * Referral screened out by Clinical Social Worker because none of the following criteria appear to apply: ~ History of anxiety/depression during this pregnancy, or of post-partum depression following prior delivery. ~ Diagnosis of anxiety and/or depression within last 3 years OR * MOB's symptoms currently being treated with medication and/or therapy. Please contact the Clinical Social Worker if needs arise, by MOB request, or if MOB scores greater than 9/yes to question 10 on Edinburgh Postpartum Depression Screen.  Signed,  Trevionne Advani K. Jericho Alcorn, MSW, LCSWA, LCASA 06/11/2022 3:21 PM 

## 2022-06-11 NOTE — Progress Notes (Signed)
POSTPARTUM PROGRESS NOTE  Post Partum Day 1  Subjective:  Julie Cherry is a 21 y.o. G1P1001 s/p VD at [redacted]w[redacted]d.  She reports she is doing well. No acute events overnight. She denies any problems with ambulating, voiding or po intake. Denies nausea or vomiting.  Pain is well controlled.  Lochia is mild.  Objective: Blood pressure 126/60, pulse 78, temperature 98 F (36.7 C), temperature source Oral, resp. rate 16, last menstrual period 08/28/2021, SpO2 100 %, unknown if currently breastfeeding.  Physical Exam:  General: alert, cooperative and no distress Chest: no respiratory distress Heart:regular rate, distal pulses intact Uterine Fundus: firm, appropriately tender DVT Evaluation: No calf swelling or tenderness Extremities: minimal edema Skin: warm, dry  Recent Labs    06/10/22 0106  HGB 10.8*  HCT 34.2*    Assessment/Plan: Julie Cherry is a 21 y.o. G1P1001 s/p VD at [redacted]w[redacted]d   PPD#1 - Doing well  Routine postpartum care  Contraception: Nexplanon inpatient, plan on doing later today Feeding: Breast Dispo: Initially planned for dc today, but infant staying. Did discharge teaching today. Plan for dc home tomorrow.    LOS: 1 day   Leticia Penna, DO  OB Fellow  06/11/2022, 5:36 PM

## 2022-06-12 LAB — T.PALLIDUM AB, TOTAL: T Pallidum Abs: REACTIVE — AB

## 2022-06-12 NOTE — Lactation Note (Signed)
This note was copied from a baby's chart. Lactation Consultation Note  Patient Name: Julie Cherry Date: 06/12/2022 Reason for consult: Follow-up assessment Age:23 hours  P1 mother whose infant is now 67 hours old.  This is a term baby at 40+6 weeks.  Mother's feeding preference is breast.  Mother had no questions/concerns related to breast feeding.  Last LATCH score was a 9; voiding/stooling well.  Mother is familiar with engorgement and did not wish to review.  She has our OP phone number for any concerns after discharge.  Support person present.   Maternal Data    Feeding    LATCH Score                    Lactation Tools Discussed/Used    Interventions    Discharge Discharge Education: Engorgement and breast care  Consult Status Consult Status: Complete Date: 06/12/22 Follow-up type: Call as needed    Julie Cherry 06/12/2022, 2:45 PM

## 2022-06-12 NOTE — Lactation Note (Addendum)
This note was copied from a baby's chart. Lactation Consultation Note  Patient Name: Julie Cherry Date: 06/12/2022 Reason for consult: Follow-up assessment;Mother's request Age:23 hours  P1, Baby sucking on pacifier in room.  8% weight loss. Noted another pacifier in crib.   Provided education - due to weight loss suggest not using pacifier until 3 weeks and offering both breasts per session.  Suggest allowing baby to breastfeed as long as she would like on the first breast and then offering the second breast. If she does not latch, she is finished with breastfeeding session. Assisted with latching in football hold.   Baby latched with ease and noted swallows.  Maternal Data Has patient been taught Hand Expression?: Yes  Feeding Mother's Current Feeding Choice: Breast Milk  LATCH Score Latch: Grasps breast easily, tongue down, lips flanged, rhythmical sucking.  Audible Swallowing: A few with stimulation  Type of Nipple: Everted at rest and after stimulation  Comfort (Breast/Nipple): Soft / non-tender  Hold (Positioning): Assistance needed to correctly position infant at breast and maintain latch.  LATCH Score: 8   Interventions Interventions: Breast feeding basics reviewed;Assisted with latch;Skin to skin;Education  Discharge    Consult Status Consult Status: Follow-up Date: 06/13/22 Follow-up type: In-patient    Dahlia Byes [facility] 06/12/2022, 9:55 AM

## 2022-06-19 ENCOUNTER — Telehealth (HOSPITAL_COMMUNITY): Payer: Self-pay | Admitting: *Deleted

## 2022-06-19 NOTE — Telephone Encounter (Signed)
Patient voiced no questions or concerns regarding her health at this time. EPDS=0. Patient voiced no questions or concerns regarding infant at this time. Patient reports infant sleeps in a bassinet on her back. RN reviewed ABCs of safe sleep. Patient verbalized understanding. Patient requested RN email information on hospital's virtual postpartum classes and support groups. Email sent. Deforest Hoyles, RN, 06/19/22, [phone]

## 2022-07-27 ENCOUNTER — Ambulatory Visit (INDEPENDENT_AMBULATORY_CARE_PROVIDER_SITE_OTHER): Payer: Medicaid Other | Admitting: Obstetrics and Gynecology

## 2022-07-27 ENCOUNTER — Encounter: Payer: Self-pay | Admitting: Obstetrics and Gynecology

## 2022-07-27 ENCOUNTER — Other Ambulatory Visit (HOSPITAL_COMMUNITY)
Admission: RE | Admit: 2022-07-27 | Discharge: 2022-07-27 | Disposition: A | Discharge status: Home / Self Care | Payer: Medicaid Other | Source: Ambulatory Visit | Attending: Obstetrics and Gynecology | Admitting: Obstetrics and Gynecology

## 2022-07-27 DIAGNOSIS — Z8619 Personal history of other infectious and parasitic diseases: Secondary | ICD-10-CM

## 2022-07-27 DIAGNOSIS — R87613 High grade squamous intraepithelial lesion on cytologic smear of cervix (HGSIL): Secondary | ICD-10-CM | POA: Insufficient documentation

## 2022-07-27 NOTE — Progress Notes (Signed)
..   Post Partum Visit Note  Julie Cherry is a 21 y.o. G73P1001 female who presents for a postpartum visit. She is 7 weeks postpartum following a normal spontaneous vaginal delivery.  I have fully reviewed the prenatal and intrapartum course. The delivery was at 40.6 gestational weeks.  Anesthesia: none. Postpartum course has been good. Baby is doing well. Baby is feeding by bottle - Enfamil . Bleeding staining only. Bowel function is normal. Bladder function is normal. Patient is not sexually active. Contraception method is Nexplanon. Postpartum depression screening: negative.   The pregnancy intention screening data noted above was reviewed. Potential methods of contraception were discussed. The patient elected to proceed with nexplanon   Edinburgh Postnatal Depression Scale - 07/27/22 1103       Edinburgh Postnatal Depression Scale:  In the Past 7 Days   I have been able to laugh and see the funny side of things. 0    I have looked forward with enjoyment to things. 0    I have blamed myself unnecessarily when things went wrong. 0    I have been anxious or worried for no good reason. 0    I have felt scared or panicky for no good reason. 0    Things have been getting on top of me. 0    I have been so unhappy that I have had difficulty sleeping. 0    I have felt sad or miserable. 0    I have been so unhappy that I have been crying. 0    The thought of harming myself has occurred to me. 0    Edinburgh Postnatal Depression Scale Total 0             Health Maintenance Due  Topic Date Due   COVID-19 Vaccine (1) Never done   HPV VACCINES (1 - 2-dose series) Never done   INFLUENZA VACCINE  07/04/2022    The following portions of the patient's history were reviewed and updated as appropriate: allergies, current medications, past family history, past medical history, past social history, past surgical history, and problem list.  Review of Systems Pertinent items are  noted in HPI.  Objective:  BP 116/73   Pulse 89   Wt 158 lb (71.7 kg)   LMP 08/28/2021 (Approximate)   Breastfeeding No   BMI 27.99 kg/m    General:  alert, cooperative, and no distress   Breasts:  not indicated  Lungs: clear to auscultation bilaterally  Heart:  regular rate and rhythm  Abdomen: soft, non-tender; bowel sounds normal; no masses,  no organomegaly   Wound N/a  GU exam:   Normal external genitalia, normal vagina and cervix, pap taken without incident, chaperone present       Assessment:     Normal postpartum exam.   Plan:   Essential components of care per ACOG recommendations:  1.  Mood and well being: Patient with negative depression screening today. Reviewed local resources for support.  - Patient tobacco use? No.   - hx of drug use? Pt uses social THC, discussed cessation  2. Infant care and feeding:  -Patient currently breastmilk feeding? No.  -Social determinants of health (SDOH) reviewed in EPIC. No concerns.  3. Sexuality, contraception and birth spacing - Patient does not want a pregnancy in the next year.  Desired family size is 2 children.  - Reviewed reproductive life planning. Reviewed contraceptive methods based on pt preferences and effectiveness.  Patient desired Hormonal Implant today.   -  Discussed birth spacing of 18 months  4. Sleep and fatigue -Encouraged family/partner/community support of 4 hrs of uninterrupted sleep to help with mood and fatigue  5. Physical Recovery  - Discussed patients delivery and complications. She describes her labor as good. - Patient had a Vaginal, no problems at delivery. Patient had a  periurethral  laceration. Perineal healing reviewed. Patient expressed understanding - Patient has urinary incontinence? No. - Patient is safe to resume physical and sexual activity  6.  Health Maintenance - HM due items addressed Yes - Last pap smear  Diagnosis  Date Value Ref Range Status  12/22/2021 (A)     -  High grade squamous intraepithelial lesion (HSIL)   Pap smear done at today's visit.  -Breast Cancer screening indicated? No.   7. Chronic Disease/Pregnancy Condition follow up: Abnormal pap smear  Will check RPR titer 10/23, last was 1:4  Warden Fillers, MD Center for [employer], [facility]

## 2022-08-02 LAB — CYTOLOGY - PAP
Chlamydia: NEGATIVE
Comment: NEGATIVE
Comment: NEGATIVE
Comment: NORMAL
Diagnosis: UNDETERMINED — AB
High risk HPV: POSITIVE — AB
Neisseria Gonorrhea: NEGATIVE

## 2022-09-05 ENCOUNTER — Other Ambulatory Visit: Payer: Medicaid Other

## 2023-01-31 ENCOUNTER — Ambulatory Visit: Payer: Medicaid Other | Admitting: Obstetrics and Gynecology

## 2023-02-01 ENCOUNTER — Other Ambulatory Visit (HOSPITAL_COMMUNITY)
Admission: RE | Admit: 2023-02-01 | Discharge: 2023-02-01 | Disposition: A | Discharge status: Home / Self Care | Payer: Medicaid Other | Source: Ambulatory Visit | Attending: Obstetrics and Gynecology | Admitting: Obstetrics and Gynecology

## 2023-02-01 ENCOUNTER — Ambulatory Visit (INDEPENDENT_AMBULATORY_CARE_PROVIDER_SITE_OTHER): Payer: Medicaid Other | Admitting: Obstetrics & Gynecology

## 2023-02-01 ENCOUNTER — Encounter: Payer: Self-pay | Admitting: Obstetrics & Gynecology

## 2023-02-01 VITALS — BP 119/75 | HR 88 | Ht 63.0 in | Wt 149.0 lb

## 2023-02-01 DIAGNOSIS — N3000 Acute cystitis without hematuria: Secondary | ICD-10-CM

## 2023-02-01 DIAGNOSIS — Z8619 Personal history of other infectious and parasitic diseases: Secondary | ICD-10-CM | POA: Diagnosis not present

## 2023-02-01 DIAGNOSIS — Z975 Presence of (intrauterine) contraceptive device: Secondary | ICD-10-CM | POA: Diagnosis not present

## 2023-02-01 LAB — POCT URINALYSIS DIPSTICK
Bilirubin, UA: POSITIVE
Glucose, UA: NEGATIVE
Ketones, UA: POSITIVE
Nitrite, UA: POSITIVE
Protein, UA: POSITIVE — AB
Spec Grav, UA: >=1.03 — AB (ref 1.010–1.025)
Urobilinogen, UA: 0.2 E.U./dL
pH, UA: 6 (ref 5.0–8.0)

## 2023-02-01 MED ORDER — SULFAMETHOXAZOLE-TRIMETHOPRIM 800-160 MG PO TABS: 1.0000 | ORAL_TABLET | Freq: Two times a day (BID) | ORAL | 0 refills | Status: DC

## 2023-02-01 NOTE — Progress Notes (Signed)
Patient ID: [id number] Julie Cherry, female   DOB: 09/14/2001, 22 y.o.   MRN: [id number]  Chief Complaint  Patient presents with   Contraception  STD screen  HPI Julie Cherry is a 22 y.o. female.  G1P1001 Patient's last menstrual period was 01/23/2023 (exact date). She is having no menstrual problems with her Nexplanon in  place and she will continue to use this. She notes UTI sx and wants STD screening HPI  Past Medical History:  Diagnosis Date   Asthma    Last had symptoms age 65.    At risk for sexually transmitted disease due to unprotected sex 08/06/2019   Breakthrough bleeding on Nexplanon 07/19/2017   Chlamydia 07/09/2019   Deliberate self-cutting 09/10/2013   Pyelonephritis 05/27/2016   Vaccination not carried out because of caregiver refusal 09/10/2013   Mom refuses flu and HPV vaccine    Past Surgical History:  Procedure Laterality Date   MOUTH SURGERY     TOOTH EXTRACTION  Aug 2014   severe decay    Family History  Problem Relation Age of Onset   Asthma Father    Diabetes Paternal Grandfather    Healthy Mother     Social History Social History   Tobacco Use   Smoking status: Never   Smokeless tobacco: Never  Vaping Use   Vaping Use: Never used  Substance Use Topics   Alcohol use: No   Drug use: No    Allergies  Allergen Reactions   Pork-Derived Products     Patient does not eat pork but can have meds/products with pork     Current Outpatient Medications  Medication Sig Dispense Refill   sulfamethoxazole-trimethoprim (BACTRIM DS) 800-160 MG tablet Take 1 tablet by mouth 2 (two) times daily. 6 tablet 0   ibuprofen (ADVIL) 600 MG tablet Take 1 tablet (600 mg total) by mouth every 6 (six) hours as needed. (Patient not taking: Reported on 07/27/2022) 60 tablet 0   Prenatal Vit-Fe Fumarate-FA (MULTIVITAMIN-PRENATAL) 27-0.8 MG TABS tablet Take 1 tablet by mouth daily at 12 noon. (Patient not taking: Reported on 07/27/2022)     No current  facility-administered medications for this visit.    Review of Systems Review of Systems  Constitutional: Negative.   Respiratory: Negative.    Cardiovascular: Negative.   Gastrointestinal: Negative.   Genitourinary:  Positive for dysuria.    Blood pressure 119/75, pulse 88, height '5\' 3"'$  (1.6 m), weight 67.6 kg, last menstrual period 01/23/2023, not currently breastfeeding.  Physical Exam Physical Exam Vitals and nursing note reviewed.  Constitutional:      Appearance: Normal appearance. She is not ill-appearing.  Cardiovascular:     Rate and Rhythm: Normal rate.  Pulmonary:     Effort: Pulmonary effort is normal.  Skin:    General: Skin is warm and dry.  Psychiatric:        Mood and Affect: Mood normal.        Behavior: Behavior normal.     Data Reviewed Urine dipstick shows positive for nitrites.  Micro exam:  1+ leuk  Assessment Acute cystitis without hematuria - Plan: sulfamethoxazole-trimethoprim (BACTRIM DS) 800-160 MG tablet  Hx of syphilis - Plan: POCT Urinalysis Dipstick, Urine Culture, Cervicovaginal ancillary only( [place]), RPR, HIV antibody (with reflex), CANCELED: RPR   Plan F/u today for treatment of syphilis. Tx for cystitis    Jeneen Rinks Arnoldf/u for  02/01/2023, 4:33 PM

## 2023-02-01 NOTE — Progress Notes (Addendum)
22 y.o. GYN presents for [facility] management.  She was previously having mood swings which has since stopped.  C/o recurrent BV, yeast, UTI odor in urine. Self swab done today.  Last PAP 07/27/2022 +HPV and ASCUS.

## 2023-02-02 LAB — CERVICOVAGINAL ANCILLARY ONLY
Bacterial Vaginitis (gardnerella): POSITIVE — AB
Candida Glabrata: NEGATIVE
Candida Vaginitis: NEGATIVE
Chlamydia: NEGATIVE
Comment: NEGATIVE
Comment: NEGATIVE
Comment: NEGATIVE
Comment: NEGATIVE
Comment: NEGATIVE
Comment: NORMAL
Neisseria Gonorrhea: NEGATIVE
Trichomonas: POSITIVE — AB

## 2023-02-03 LAB — RPR, QUANT+TP ABS (REFLEX)
Rapid Plasma Reagin, Quant: 1:2 {titer} — ABNORMAL HIGH
T Pallidum Abs: REACTIVE — AB

## 2023-02-03 LAB — RPR: RPR Ser Ql: REACTIVE — AB

## 2023-02-03 LAB — HIV ANTIBODY (ROUTINE TESTING W REFLEX): HIV Screen 4th Generation wRfx: NONREACTIVE

## 2023-02-07 LAB — URINE CULTURE

## 2023-03-19 ENCOUNTER — Encounter: Payer: Self-pay | Admitting: *Deleted

## 2023-04-17 ENCOUNTER — Ambulatory Visit (HOSPITAL_COMMUNITY)
Admission: EM | Admit: 2023-04-17 | Discharge: 2023-04-17 | Disposition: A | Discharge status: Home / Self Care | Payer: Medicaid Other | Attending: Family Medicine | Admitting: Family Medicine

## 2023-04-17 ENCOUNTER — Encounter (HOSPITAL_COMMUNITY): Payer: Self-pay | Admitting: *Deleted

## 2023-04-17 ENCOUNTER — Other Ambulatory Visit: Payer: Self-pay

## 2023-04-17 DIAGNOSIS — R300 Dysuria: Secondary | ICD-10-CM

## 2023-04-17 DIAGNOSIS — Z113 Encounter for screening for infections with a predominantly sexual mode of transmission: Secondary | ICD-10-CM | POA: Diagnosis not present

## 2023-04-17 DIAGNOSIS — R3915 Urgency of urination: Secondary | ICD-10-CM | POA: Diagnosis present

## 2023-04-17 DIAGNOSIS — R350 Frequency of micturition: Secondary | ICD-10-CM | POA: Insufficient documentation

## 2023-04-17 LAB — POCT URINALYSIS DIP (MANUAL ENTRY)
Bilirubin, UA: NEGATIVE
Glucose, UA: NEGATIVE mg/dL
Ketones, POC UA: NEGATIVE mg/dL
Nitrite, UA: NEGATIVE
Protein Ur, POC: NEGATIVE mg/dL
Spec Grav, UA: 1.025 (ref 1.010–1.025)
Urobilinogen, UA: 0.2 E.U./dL
pH, UA: 5.5 (ref 5.0–8.0)

## 2023-04-17 LAB — POCT URINE PREGNANCY: Preg Test, Ur: NEGATIVE

## 2023-04-17 NOTE — ED Triage Notes (Signed)
C/O urinary urgency and polyuria, along with foul vaginal odor and intermittent low abd cramping for past 1 wk. Denies fevers. Denies vaginal discharge.

## 2023-04-17 NOTE — ED Notes (Signed)
Pt states she needs to go to work she can't wait for her appt, provider aware. Pt gave information for billing to get copay back.

## 2023-04-17 NOTE — ED Provider Notes (Signed)
  [facility]   [phone] 04/17/23 Arrival Time: 1536  ASSESSMENT & PLAN:  Pt left without being seen. Informed nurse of her departure.     Mardella Layman, MD 04/17/23 1725

## 2023-04-18 ENCOUNTER — Telehealth (HOSPITAL_COMMUNITY): Payer: Self-pay | Admitting: Emergency Medicine

## 2023-04-18 LAB — CERVICOVAGINAL ANCILLARY ONLY
Bacterial Vaginitis (gardnerella): POSITIVE — AB
Candida Glabrata: NEGATIVE
Candida Vaginitis: NEGATIVE
Chlamydia: NEGATIVE
Comment: NEGATIVE
Comment: NEGATIVE
Comment: NEGATIVE
Comment: NEGATIVE
Comment: NEGATIVE
Comment: NORMAL
Neisseria Gonorrhea: NEGATIVE
Trichomonas: NEGATIVE

## 2023-04-18 MED ORDER — METRONIDAZOLE 500 MG PO TABS: 500.0000 mg | ORAL_TABLET | Freq: Two times a day (BID) | ORAL | 0 refills | Status: DC

## 2023-10-30 ENCOUNTER — Ambulatory Visit (HOSPITAL_COMMUNITY)
Admission: EM | Admit: 2023-10-30 | Discharge: 2023-10-30 | Disposition: A | Discharge status: Home / Self Care | Payer: Medicaid Other | Attending: Internal Medicine | Admitting: Internal Medicine

## 2023-10-30 ENCOUNTER — Encounter (HOSPITAL_COMMUNITY): Payer: Self-pay

## 2023-10-30 DIAGNOSIS — Z113 Encounter for screening for infections with a predominantly sexual mode of transmission: Secondary | ICD-10-CM | POA: Insufficient documentation

## 2023-10-30 LAB — HIV ANTIBODY (ROUTINE TESTING W REFLEX): HIV Screen 4th Generation wRfx: NONREACTIVE

## 2023-10-30 NOTE — Discharge Instructions (Signed)
STD testing is pending.  We will call if anything is positive.

## 2023-10-30 NOTE — ED Triage Notes (Signed)
Pt would like std testing today with blood work.  Pt denies any symptoms at this time.

## 2023-10-30 NOTE — ED Provider Notes (Signed)
[facility]    CSN: [id number] Arrival date & time: 10/30/23  1245      History   Chief Complaint Chief Complaint  Patient presents with   Exposure to STD    HPI Julie Cherry is a 22 y.o. female.   Patient presents today for routine STD testing.  She denies any symptoms or any confirmed exposure.  Last menstrual cycle was 10/07/2023.  She has Nexplanon for birth control.   Exposure to STD    Past Medical History:  Diagnosis Date   Asthma    Last had symptoms age 26.    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 Authorizing Provider  ibuprofen (ADVIL) 600 MG tablet Take 1 tablet (600 mg total) by mouth every 6 (six) hours as needed. Patient not taking: Reported on 07/27/2022 06/11/22   Allayne Stack, DO  metroNIDAZOLE (FLAGYL) 500 MG tablet Take 1 tablet (500 mg total) by mouth 2 (two) times daily. 04/18/23   LampteyBritta Mccreedy, MD   Prenatal Vit-Fe Fumarate-FA (MULTIVITAMIN-PRENATAL) 27-0.8 MG TABS tablet Take 1 tablet by mouth daily at 12 noon.    [provider]  sulfamethoxazole-trimethoprim (BACTRIM DS) 800-160 MG tablet Take 1 tablet by mouth 2 (two) times daily. 02/01/23   Adam Phenix, MD    Family History Family History  Problem Relation Age of Onset   Asthma Father    Diabetes Paternal Grandfather    Healthy Mother     Social History Social History   Tobacco Use   Smoking status: Never   Smokeless tobacco: Never  Vaping Use   Vaping status: Never Used  Substance Use Topics   Alcohol use: No   Drug use: No     Allergies   Pork-derived products   Review of Systems Review of Systems Per HPI  Physical Exam Triage Vital Signs ED Triage Vitals [10/30/23 1344]  Encounter Vitals Group     BP 118/74     Systolic BP Percentile      Diastolic BP Percentile      Pulse Rate 76     Resp 16  Temp 98.1 F (36.7 C)     Temp Source Oral     SpO2 98 %     Weight      Height      Head Circumference      Peak Flow      Pain Score      Pain Loc      Pain Education      Exclude from Growth Chart    No data found.  Updated Vital Signs BP 118/74 (BP Location: Left Arm)   Pulse 76   Temp 98.1 F (36.7 C) (Oral)   Resp 16   LMP 10/07/2023   SpO2 98%   Visual Acuity Right Eye Distance:   Left Eye Distance:   Bilateral Distance:    Right Eye Near:   Left Eye Near:    Bilateral Near:     Physical Exam Constitutional:      General: She is not in acute distress.    Appearance: Normal appearance. She is not toxic-appearing or diaphoretic.  HENT:     Head: Normocephalic and atraumatic.  Eyes:     Extraocular Movements: Extraocular movements intact.     Conjunctiva/sclera: Conjunctivae normal.  Pulmonary:     Effort: Pulmonary effort is normal.  Genitourinary:    Comments: Deferred with shared decision making. Self swab performed.  Neurological:     General: No  focal deficit present.     Mental Status: She is alert and oriented to person, place, and time. Mental status is at baseline.  Psychiatric:        Mood and Affect: Mood normal.        Behavior: Behavior normal.        Thought Content: Thought content normal.        Judgment: Judgment normal.      UC Treatments / Results  Labs (all labs ordered are listed, but only abnormal results are displayed) Labs Reviewed  RPR  HIV ANTIBODY (ROUTINE TESTING W REFLEX)  CERVICOVAGINAL ANCILLARY ONLY    EKG   Radiology No results found.  Procedures Procedures (including critical care time)  Medications Ordered in UC Medications - No data to display  Initial Impression / Assessment and Plan / UC Course  I have reviewed the triage vital signs and the nursing notes.  Pertinent labs & imaging results that were available during my care of the patient were reviewed by me and considered in my medical decision making (see chart for details).     Cervicovaginal swab, HIV, RPR test pending.  Awaiting results given no confirmed exposure.  Advised strict follow-up precautions.  Patient verbalized understanding and was agreeable with plan. Final Clinical Impressions(s) / UC Diagnoses   Final diagnoses:  Screening examination for venereal disease     Discharge Instructions      STD testing is pending.  We will call if anything is positive.    ED Prescriptions   None    PDMP not reviewed this encounter.   Gustavus Bryant, [place] 10/30/23 1432

## 2023-10-31 LAB — CERVICOVAGINAL ANCILLARY ONLY
Chlamydia: NEGATIVE
Comment: NEGATIVE
Comment: NEGATIVE
Comment: NORMAL
Neisseria Gonorrhea: NEGATIVE
Trichomonas: NEGATIVE

## 2023-10-31 LAB — RPR
RPR Ser Ql: REACTIVE — AB
RPR Titer: 1:1 {titer}

## 2023-11-02 LAB — T.PALLIDUM AB, TOTAL: T Pallidum Abs: REACTIVE — AB

## 2023-11-22 ENCOUNTER — Ambulatory Visit (HOSPITAL_COMMUNITY)
Admission: EM | Admit: 2023-11-22 | Discharge: 2023-11-22 | Disposition: A | Discharge status: Home / Self Care | Payer: Medicaid Other

## 2023-11-22 ENCOUNTER — Ambulatory Visit (HOSPITAL_COMMUNITY): Payer: Medicaid Other

## 2023-11-22 ENCOUNTER — Encounter (HOSPITAL_COMMUNITY): Payer: Self-pay

## 2023-11-22 DIAGNOSIS — S6991XA Unspecified injury of right wrist, hand and finger(s), initial encounter: Secondary | ICD-10-CM

## 2023-11-22 MED ORDER — IBUPROFEN 800 MG PO TABS: 800.0000 mg | ORAL_TABLET | Freq: Three times a day (TID) | ORAL | 0 refills | Status: AC

## 2023-11-22 NOTE — ED Triage Notes (Signed)
Patient here today with c/o right 5th finger injury a week ago. Patient has some abrasions on her finger. Patient states that she fell and scraped it on the concrete. She took some Advil with no relief.

## 2023-11-22 NOTE — ED Provider Notes (Signed)
[facility]    CSN: [id number] Arrival date & time: 11/22/23  1839     History   Chief Complaint Chief Complaint  Patient presents with   Finger Injury    HPI Julie Cherry is a 22 y.o. female.  Larey Seat about 6 days ago and hurt right hand 5th finger Today rating 9/10 pain However she does report bumping it on some things since the injury. She also works with her hands. Tried advil once at onset. No recent meds No prior injury known   Past Medical History:  Diagnosis Date   Asthma    Last had symptoms age 1.    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 Authorizing Provider  etonogestrel (NEXPLANON) 68 MG IMPL implant 1 each by Subdermal route once.   Yes [provider]  ibuprofen (ADVIL) 800 MG tablet Take 1 tablet (800 mg total) by mouth 3 (three) times daily. 11/22/23  Yes Rhyann Berton, Lurena Joiner, PA-C    Family History Family History   Problem Relation Age of Onset   Asthma Father    Diabetes Paternal Grandfather    Healthy Mother     Social History Social History   Tobacco Use   Smoking status: Never   Smokeless tobacco: Never  Vaping Use   Vaping status: Never Used  Substance Use Topics   Alcohol use: No   Drug use: No     Allergies   Pork-derived products   Review of Systems Review of Systems  Per HPI  Physical Exam Triage Vital Signs ED Triage Vitals [11/22/23 1919]  Encounter Vitals Group     BP 108/70     Systolic BP Percentile      Diastolic BP Percentile      Pulse Rate 78     Resp 16     Temp 99 F (37.2 C)     Temp Source Oral     SpO2 96 %     Weight 140 lb (63.5 kg)     Height 5\' 3"  (1.6 m)     Head Circumference      Peak Flow  Pain Score 9     Pain Loc      Pain Education      Exclude from Growth Chart    No data found.  Updated Vital Signs BP 108/70 (BP Location: Left Arm)   Pulse 78   Temp 99 F (37.2 C) (Oral)   Resp 16   Ht 5\' 3"  (1.6 m)   Wt 140 lb (63.5 kg)   LMP 11/05/2023 (Approximate)   SpO2 96%   Breastfeeding No   BMI 24.80 kg/m   Physical Exam Vitals and nursing note reviewed.  Constitutional:      General: She is not in acute distress. HENT:     Mouth/Throat:     Pharynx: Oropharynx is clear.  Cardiovascular:     Rate and Rhythm: Normal rate and regular rhythm.     Pulses: Normal pulses.     Heart sounds: Normal heart sounds.  Pulmonary:     Effort: Pulmonary effort is normal.     Breath sounds: Normal breath sounds.  Musculoskeletal:        General: Tenderness and signs of injury present.     Cervical back: Normal range of motion.     Comments: Tender over right hand 5th finger PIP. There is small abrasion and scab here. No severe swelling, erythema, or warmth. Distal sensation intact, cap refill half a second.  Skin:    Capillary Refill: Capillary refill takes less than 2 seconds.  Neurological:     Mental Status: She is alert  and oriented to person, place, and time.     UC Treatments / Results  Labs (all labs ordered are listed, but only abnormal results are displayed) Labs Reviewed - No data to display  EKG  Radiology No results found.  Procedures Procedures (including critical care time)  Medications Ordered in UC Medications - No data to display  Initial Impression / Assessment and Plan / UC Course  I have reviewed the triage vital signs and the nursing notes.  Pertinent labs & imaging results that were available during my care of the patient were reviewed by me and considered in my medical decision making (see chart for details).  Right hand xray without obvious abnormality Occurred 6 days ago and patient has only used advil once. I have advised using for the next few days more consistently to see if pain improves. Also recommend elevating, protecting with soft gauze splint to avoid hitting on things. Can follow up with hand or ortho if persisting. Low concern for infection at this time. Discussed signs to monitor and return for if needed. Wrapped lightly with gauze and coban for protection.   Awaiting radiology final read.  Final Clinical Impressions(s) / UC Diagnoses   Final diagnoses:  Injury of finger of right hand, initial encounter     Discharge Instructions      You can use ibuprofen every 6 hours if needed for pain and inflammation Keep wrapped with gauze to protect the area and avoid hitting it further Elevate the hand to reduce swelling If symptoms persist over the next week, please follow with the hand specialist      ED Prescriptions     Medication Sig Dispense Auth. Provider   ibuprofen (ADVIL) 800 MG tablet Take 1 tablet (800 mg total) by mouth 3 (three) times daily. 21 tablet Rosemarie Galvis, Lurena Joiner, PA-C      PDMP not reviewed this encounter.   Emre Stock, [place] 11/22/23 2013

## 2023-11-22 NOTE — Discharge Instructions (Addendum)
You can use ibuprofen every 6 hours if needed for pain and inflammation Keep wrapped with gauze to protect the area and avoid hitting it further Elevate the hand to reduce swelling If symptoms persist over the next week, please follow with the hand specialist

## 2024-04-30 ENCOUNTER — Ambulatory Visit (HOSPITAL_COMMUNITY)
Admission: EM | Admit: 2024-04-30 | Discharge: 2024-04-30 | Disposition: A | Discharge status: Home / Self Care | Attending: Emergency Medicine | Admitting: Emergency Medicine

## 2024-04-30 ENCOUNTER — Encounter (HOSPITAL_COMMUNITY): Payer: Self-pay

## 2024-04-30 DIAGNOSIS — J0110 Acute frontal sinusitis, unspecified: Secondary | ICD-10-CM

## 2024-04-30 MED ORDER — AZELASTINE HCL 0.1 % NA SOLN: 2.0000 | Freq: Two times a day (BID) | NASAL | 0 refills | Status: AC

## 2024-04-30 MED ORDER — AMOXICILLIN-POT CLAVULANATE 875-125 MG PO TABS: 1.0000 | ORAL_TABLET | Freq: Two times a day (BID) | ORAL | 0 refills | Status: AC

## 2024-04-30 NOTE — Discharge Instructions (Addendum)
 Starting Augmentin  twice daily for 7 days for sinus infection. Use azelastine  nasal spray twice daily to help with congestion. Otherwise you can use Mucinex as needed for congestion and cough. Alternate between 650 mg of Tylenol  and 400 mg of ibuprofen  every 6-8 hours as needed for pain or fever. Make sure you are staying hydrated and getting plenty of rest. Return here if symptoms persist or worsen.

## 2024-04-30 NOTE — ED Provider Notes (Signed)
 [facility]    CSN: [id number] Arrival date & time: 04/30/24  1834      History   Chief Complaint Chief Complaint  Patient presents with   Nasal Congestion    HPI Julie Cherry is a 23 y.o. female.   Patient presents with congestion, sinus headache, sinus pressure/pain, mild sore throat, chills, and bodyaches x 2 weeks.  Patient denies any known fever, shortness of breath, chest pain, nausea, vomiting, and diarrhea, abdominal pain.  Patient reports that she has been taking Sudafed and Advil  with minimal relief.  The history is provided by the patient and medical records.    Past Medical History:  Diagnosis Date   Asthma    Last had symptoms age 27.    At risk for sexually transmitted disease due to unprotected sex 08/06/2019   Breakthrough bleeding on Nexplanon  07/19/2017   Chlamydia 07/09/2019   Deliberate self-cutting 09/10/2013   Pyelonephritis 05/27/2016   Vaccination not carried out because of caregiver refusal 09/10/2013   Mom refuses flu and HPV vaccine    Patient Active Problem List   Diagnosis Date Noted   Nexplanon  in place 02/01/2023   Abnormal Pap smear of cervix 01/27/2022   Alpha thalassemia silent carrier 01/20/2022   Syphilis affecting pregnancy in third trimester 12/29/2021   Congenital toe deformity- third left toe 02/16/2020   Vitamin D  deficiency 01/20/2016   Heart murmur 12/02/2013    Past Surgical History:  Procedure Laterality Date   MOUTH SURGERY     TOOTH EXTRACTION  Aug 2014   severe decay    OB History     Gravida  1   Para  1   Term  1   Preterm      AB      Living  1      SAB      IAB      Ectopic      Multiple  0   Live Births  1            Home Medications    Prior to Admission medications   Medication Sig Start Date End Date Taking? Authorizing Provider  amoxicillin -clavulanate (AUGMENTIN ) 875-125 MG tablet Take 1 tablet by mouth every 12 (twelve) hours. 04/30/24  Yes Rosevelt Constable,  Alroy Portela A, NP  azelastine  (ASTELIN ) 0.1 % nasal spray Place 2 sprays into both nostrils 2 (two) times daily. Use in each nostril as directed 04/30/24  Yes Levora Reas A, NP  etonogestrel  (NEXPLANON ) 68 MG IMPL implant 1 each by Subdermal route once.    [provider]  ibuprofen  (ADVIL ) 800 MG tablet Take 1 tablet (800 mg total) by mouth 3 (three) times daily. 11/22/23   Rising, Ivette Marks, PA-C    Family History Family History  Problem Relation Age of Onset   Asthma Father    Diabetes Paternal Grandfather    Healthy Mother     Social History Social History   Tobacco Use   Smoking status: Never   Smokeless tobacco: Never  Vaping Use   Vaping status: Never Used  Substance Use Topics   Alcohol use: No   Drug use: No     Allergies   Pork-derived products   Review of Systems Review of Systems  Per HPI  Physical Exam Triage Vital Signs ED Triage Vitals  Encounter Vitals Group     BP 04/30/24 1934 (!) 134/91     Systolic BP Percentile --      Diastolic BP Percentile --  Pulse Rate 04/30/24 1934 (!) 102     Resp 04/30/24 1934 16     Temp 04/30/24 1934 99.5 F (37.5 C)     Temp Source 04/30/24 1934 Oral     SpO2 04/30/24 1934 100 %     Weight --      Height --      Head Circumference --      Peak Flow --      Pain Score 04/30/24 1933 10     Pain Loc --      Pain Education --      Exclude from Growth Chart --    No data found.  Updated Vital Signs BP (!) 134/91 (BP Location: Left Arm)   Pulse (!) 102   Temp 99.5 F (37.5 C) (Oral)   Resp 16   SpO2 100%   Breastfeeding No   Visual Acuity Right Eye Distance:   Left Eye Distance:   Bilateral Distance:    Right Eye Near:   Left Eye Near:    Bilateral Near:     Physical Exam Vitals and nursing note reviewed.  Constitutional:      General: She is awake. She is not in acute distress.    Appearance: Normal appearance. She is well-developed and well-groomed. She is not ill-appearing.   HENT:     Right Ear: Tympanic membrane, ear canal and external ear normal.     Left Ear: Tympanic membrane, ear canal and external ear normal.     Nose: Congestion and rhinorrhea present.     Right Sinus: Frontal sinus tenderness present.     Left Sinus: Frontal sinus tenderness present.     Mouth/Throat:     Mouth: Mucous membranes are moist.     Pharynx: Posterior oropharyngeal erythema and postnasal drip present.  Cardiovascular:     Rate and Rhythm: Normal rate and regular rhythm.  Pulmonary:     Effort: Pulmonary effort is normal.     Breath sounds: Normal breath sounds.  Skin:    General: Skin is warm and dry.  Neurological:     Mental Status: She is alert.  Psychiatric:        Behavior: Behavior is cooperative.      UC Treatments / Results  Labs (all labs ordered are listed, but only abnormal results are displayed) Labs Reviewed - No data to display  EKG   Radiology No results found.  Procedures Procedures (including critical care time)  Medications Ordered in UC Medications - No data to display  Initial Impression / Assessment and Plan / UC Course  I have reviewed the triage vital signs and the nursing notes.  Pertinent labs & imaging results that were available during my care of the patient were reviewed by me and considered in my medical decision making (see chart for details).     Patient is well-appearing.  Vitals are stable.  Significant congestion and rhinorrhea present.  Bilateral frontal sinus tenderness noted.  Erythema and PND noted to pharynx.  Prescribed Augmentin  for sinusitis.  Prescribed azelastine  nasal spray for additional congestion relief.  Recommended Mucinex as needed for cough congestion.  Recommended alternate between Tylenol  and ibuprofen  as needed for any pain or fever.  Discussed return precautions. Final Clinical Impressions(s) / UC Diagnoses   Final diagnoses:  Acute non-recurrent frontal sinusitis     Discharge  Instructions      Starting Augmentin  twice daily for 7 days for sinus infection. Use azelastine  nasal spray twice daily to  help with congestion. Otherwise you can use Mucinex as needed for congestion and cough. Alternate between 650 mg of Tylenol  and 400 mg of ibuprofen  every 6-8 hours as needed for pain or fever. Make sure you are staying hydrated and getting plenty of rest. Return here if symptoms persist or worsen.  ED Prescriptions     Medication Sig Dispense Auth. Provider   amoxicillin -clavulanate (AUGMENTIN ) 875-125 MG tablet Take 1 tablet by mouth every 12 (twelve) hours. 14 tablet Rosevelt Constable, Adrien Shankar A, NP   azelastine (ASTELIN) 0.1 % nasal spray Place 2 sprays into both nostrils 2 (two) times daily. Use in each nostril as directed 30 mL Levora Reas A, NP      PDMP not reviewed this encounter.   Levora Reas A, NP 04/30/24 1946

## 2024-04-30 NOTE — ED Triage Notes (Signed)
 Pt states congestion,sore throat,chills,and frontal headache for the past 2 weeks.

## 2024-07-18 ENCOUNTER — Other Ambulatory Visit (HOSPITAL_COMMUNITY)
Admission: RE | Admit: 2024-07-18 | Discharge: 2024-07-18 | Disposition: A | Discharge status: Home / Self Care | Source: Ambulatory Visit | Attending: Obstetrics and Gynecology | Admitting: Obstetrics and Gynecology

## 2024-07-18 ENCOUNTER — Encounter: Payer: Self-pay | Admitting: Obstetrics and Gynecology

## 2024-07-18 ENCOUNTER — Ambulatory Visit (INDEPENDENT_AMBULATORY_CARE_PROVIDER_SITE_OTHER): Admitting: Obstetrics and Gynecology

## 2024-07-18 VITALS — BP 116/77 | HR 93 | Ht 63.0 in | Wt 150.6 lb

## 2024-07-18 DIAGNOSIS — L700 Acne vulgaris: Secondary | ICD-10-CM

## 2024-07-18 DIAGNOSIS — Z01419 Encounter for gynecological examination (general) (routine) without abnormal findings: Secondary | ICD-10-CM | POA: Insufficient documentation

## 2024-07-18 DIAGNOSIS — Z1331 Encounter for screening for depression: Secondary | ICD-10-CM

## 2024-07-18 DIAGNOSIS — Z124 Encounter for screening for malignant neoplasm of cervix: Secondary | ICD-10-CM

## 2024-07-18 DIAGNOSIS — Z113 Encounter for screening for infections with a predominantly sexual mode of transmission: Secondary | ICD-10-CM | POA: Diagnosis not present

## 2024-07-18 NOTE — Progress Notes (Signed)
 ANNUAL EXAM Patient name: Julie Cherry MRN [id number]  Date of birth: 04-16-2001 Chief Complaint:   Annual Exam  History of Present Illness:   Julie Cherry is a 23 y.o. G1P1001 being seen today for a routine annual exam.  Current complaints: acne, intermenstrual spotting, STI testing  Menstrual concerns? Yes  intermenstrual spotting; regular monthly cycles with nexplanon  in place; not first nexplanon . Wondering how long nexplanon  can remain in place.  Breast or nipple changes? No  Contraception use? Yes Nexplanon , inserted 2023 Sexually active? Yes female partner  Requesting referral to dermatology. Has acne that has not been clearing up which is not typical for her. Has acne all over with greatest focus on cheeks.  Having intermenstrual bleeding. Not sure if with ovulation. Typically spotting and not heavy bleding. No postcoital bleeding.  Patient's last menstrual period was 07/12/2024 (exact date).   The pregnancy intention screening data noted above was reviewed. Potential methods of contraception were discussed. The patient elected to proceed with No data recorded.   Last pap     Component Value Date/Time   DIAGPAP (A) 07/27/2022 1125    - Atypical squamous cells of undetermined significance (ASC-US )   DIAGPAP (A) 12/22/2021 1334    - High grade squamous intraepithelial lesion (HSIL)   HPVHIGH Positive (A) 07/27/2022 1125   ADEQPAP  07/27/2022 1125    Satisfactory for evaluation; transformation zone component PRESENT.   ADEQPAP  12/22/2021 1334    Satisfactory for evaluation; transformation zone component PRESENT.    Last mammogram: n/a.  Last colonoscopy: n/a.      07/18/2024    9:57 AM 05/25/2022    2:37 PM 12/22/2021    2:17 PM 11/24/2021    2:19 PM 07/03/2018    5:32 PM  Depression screen PHQ 2/9  Decreased Interest 0 0 0 0 0  Down, Depressed, Hopeless 0 0 0 0 0  PHQ - 2 Score 0 0 0 0 0  Altered sleeping 0 0 0 0 0  Tired, decreased  energy 0 0 0 0 0  Change in appetite 3 0 0 0 0  Feeling bad or failure about yourself  0 0 0 0 0  Trouble concentrating 0 0 0 0 0  Moving slowly or fidgety/restless 0 0 0 0 0  Suicidal thoughts 0 0 0 0   PHQ-9 Score 3 0 0 0 0  Difficult doing work/chores    Not difficult at all         07/18/2024    9:57 AM 05/25/2022    2:37 PM 11/24/2021    2:20 PM  GAD 7 : Generalized Anxiety Score  Nervous, Anxious, on Edge 0 0 0  Control/stop worrying 0 0 0  Worry too much - different things 0 0 0  Trouble relaxing  0 0  Restless 0 0 0  Easily annoyed or irritable 0 0 0  Afraid - awful might happen 0 0 0  Total GAD 7 Score  0 0  Anxiety Difficulty   Not difficult at all     Review of Systems:   Pertinent items are noted in HPI Denies any headaches, blurred vision, fatigue, shortness of breath, chest pain, abdominal pain, abnormal vaginal discharge/itching/odor/irritation, problems with periods, bowel movements, urination, or intercourse unless otherwise stated above. Pertinent History Reviewed:  Reviewed past medical,surgical, social and family history.  Reviewed problem list, medications and allergies. Physical Assessment:   Vitals:   07/18/24 0949  BP: 116/77  Pulse: 93  Weight: 150 lb 9.6 oz (68.3 kg)  Height: 5' 3 (1.6 m)  Body mass index is 26.68 kg/m.        Physical Examination:   General appearance - well appearing, and in no distress  Mental status - alert, oriented to person, place, and time  Psych:  She has a normal mood and affect  Skin - warm and dry, normal color, no suspicious lesions noted  Chest - effort normal, all lung fields clear to auscultation bilaterally  Heart - normal rate and regular rhythm  Breasts - breasts appear normal, no suspicious masses, no skin or nipple changes or  axillary nodes  Abdomen - soft, nontender, nondistended, no masses or organomegaly  Pelvic -  VULVA: normal appearing vulva with no masses, tenderness or lesions   VAGINA:  normal appearing vagina with normal color and discharge, no lesions   CERVIX: normal appearing cervix without discharge or lesions, no CMT  Thin prep pap is done with HR HPV cotesting  UTERUS: uterus is felt to be normal size, shape, consistency and nontender   ADNEXA: No adnexal masses or tenderness noted.  Extremities:  No swelling or varicosities noted  Chaperone present for exam  No results found for this or any previous visit (from the past 24 hours).    Assessment & Plan:  1. Well woman exam with routine gynecological exam (Primary) - Cervical cancer screening: Discussed screening Q3 years. Reviewed importance of annual exams and limits of pap smear. Pap with reflex HPV collected following ASCUS/HPV positive pap - GC/CT: Discussed and recommended. Pt  accepts - Gardasil: not seen on file - Birth Control: Nexplanon  - Breast Health: Encouraged self breast awareness/exams.  - Follow-up: 12 months and prn  - Cytology - PAP( [place]) - Cervicovaginal ancillary only( [place]) - RPR+HBsAg+HCVAb+...  2. Screening examination for STI STI testing completed, vaginal swab and serum testing.  - Cervicovaginal ancillary only( [place])  3. Screening for cervical cancer Pap collected today  - Cytology - PAP( [place])  4. Acne vulgaris Reports worsening acne and has not been able to manage with home interventions. Dermatology referral placed.  - Ambulatory referral to Dermatology   Orders Placed This Encounter  Procedures   RPR+HBsAg+HCVAb+...   Ambulatory referral to Dermatology    Follow-up: Return if symptoms worsen or fail to improve, for Annual GYN.  Carter Quarry, MD 07/18/2024 10:15 AM

## 2024-07-18 NOTE — Patient Instructions (Signed)
 [email or web address]

## 2024-07-18 NOTE — Progress Notes (Signed)
 Pt presents for annual. Pt has no questions or concerns at this time.

## 2024-07-21 ENCOUNTER — Ambulatory Visit: Payer: Self-pay | Admitting: Obstetrics and Gynecology

## 2024-07-21 LAB — CERVICOVAGINAL ANCILLARY ONLY
Bacterial Vaginitis (gardnerella): NEGATIVE
Candida Glabrata: NEGATIVE
Candida Vaginitis: NEGATIVE
Chlamydia: NEGATIVE
Comment: NEGATIVE
Comment: NEGATIVE
Comment: NEGATIVE
Comment: NEGATIVE
Comment: NEGATIVE
Comment: NORMAL
Neisseria Gonorrhea: NEGATIVE
Trichomonas: NEGATIVE

## 2024-07-22 LAB — RPR+HBSAG+HCVAB+...
HIV Screen 4th Generation wRfx: NONREACTIVE
Hep C Virus Ab: NONREACTIVE
Hepatitis B Surface Ag: NEGATIVE
RPR Ser Ql: REACTIVE — AB

## 2024-07-22 LAB — RPR, QUANT+TP ABS (REFLEX)
Rapid Plasma Reagin, Quant: 1:1 {titer} — ABNORMAL HIGH
T Pallidum Abs: REACTIVE — AB

## 2024-07-23 LAB — CYTOLOGY - PAP
Comment: NEGATIVE
Diagnosis: UNDETERMINED — AB
High risk HPV: NEGATIVE

## 2025-01-08 ENCOUNTER — Ambulatory Visit: Payer: Self-pay | Admitting: Physician Assistant

## 2025-01-08 VITALS — BP 114/69 | HR 92 | Ht 63.0 in | Wt 144.5 lb

## 2025-01-08 DIAGNOSIS — Z3046 Encounter for surveillance of implantable subdermal contraceptive: Secondary | ICD-10-CM

## 2025-01-08 DIAGNOSIS — Z30011 Encounter for initial prescription of contraceptive pills: Secondary | ICD-10-CM

## 2025-01-08 MED ORDER — NORETHIN ACE-ETH ESTRAD-FE 1-20 MG-MCG(24) PO TABS: 1.0000 | ORAL_TABLET | Freq: Every day | ORAL | 11 refills | Status: AC

## 2025-01-08 NOTE — Progress Notes (Signed)
" ° ° ° °  GYNECOLOGY OFFICE PROCEDURE NOTE  Julie Cherry is a 24 y.o. G1P1001 here for Nexplanon  removal.  Last pap smear was on 07/18/24 and was ASCUS HR HPV negative. No other gynecologic concerns.  Nexplanon  Removal Patient identified, informed consent performed, consent signed.   Appropriate time out taken.   Nexplanon  site identified.  Area prepped in usual sterile fashon. One ml of 1% lidocaine  was used to anesthetize the area at the distal end of the implant. A small stab incision was made right beside the implant on the distal portion.  The Nexplanon  rod was grasped using hemostats and removed without difficulty.  There was minimal blood loss. There were no complications.  3 ml of 1% lidocaine  was injected around the incision for post-procedure analgesia.  Steri-strips were applied over the small incision.  A pressure bandage was applied to reduce any bruising.    The patient tolerated the procedure well and was given post procedure instructions.  Patient is planning to use OCPs for contraception. Risks and possible adverse effects of OCPs were communicated to patient, who stated understanding. Patient was screened for any contraindications to OCP use and none were reported. Recommended that back-up method of contraception is used in the first 7 days of OCP use.   Joellyn Grandt, PA-C 01/08/25       "

## 2025-01-08 NOTE — Progress Notes (Signed)
 Pt presents for nexplanon  removal. Pt wants pills for bc No questions or concerns at this time.

## 2025-03-10 ENCOUNTER — Ambulatory Visit: Admitting: Dermatology
